# Patient Record
Sex: Female | Born: 1965 | Race: White | Hispanic: No | Marital: Single | Smoking: Never smoker
Health system: Southern US, Community
[De-identification: ages and names within clinical notes are randomized; demographics above are authoritative.]

## PROBLEM LIST (undated history)

## (undated) DIAGNOSIS — K219 Gastro-esophageal reflux disease without esophagitis: Secondary | ICD-10-CM

## (undated) DIAGNOSIS — M810 Age-related osteoporosis without current pathological fracture: Secondary | ICD-10-CM

## (undated) DIAGNOSIS — R51 Headache: Secondary | ICD-10-CM

## (undated) DIAGNOSIS — F419 Anxiety disorder, unspecified: Secondary | ICD-10-CM

## (undated) DIAGNOSIS — R002 Palpitations: Secondary | ICD-10-CM

## (undated) DIAGNOSIS — I493 Ventricular premature depolarization: Secondary | ICD-10-CM

## (undated) HISTORY — PX: ABDOMINAL HYSTERECTOMY: SHX81

## (undated) HISTORY — DX: Ventricular premature depolarization: I49.3

## (undated) HISTORY — DX: Gastro-esophageal reflux disease without esophagitis: K21.9

## (undated) HISTORY — DX: Age-related osteoporosis without current pathological fracture: M81.0

## (undated) HISTORY — DX: Anxiety disorder, unspecified: F41.9

## (undated) HISTORY — DX: Palpitations: R00.2

## (undated) HISTORY — PX: TUBAL LIGATION: SHX77

---

## 1999-01-15 ENCOUNTER — Other Ambulatory Visit: Admission: RE | Admit: 1999-01-15 | Discharge: 1999-01-15 | Payer: Self-pay | Admitting: *Deleted

## 2000-09-23 ENCOUNTER — Other Ambulatory Visit: Admission: RE | Admit: 2000-09-23 | Discharge: 2000-09-23 | Payer: Self-pay | Admitting: Obstetrics and Gynecology

## 2000-09-23 ENCOUNTER — Encounter (INDEPENDENT_AMBULATORY_CARE_PROVIDER_SITE_OTHER): Payer: Self-pay

## 2002-06-20 ENCOUNTER — Ambulatory Visit (HOSPITAL_COMMUNITY): Admission: RE | Admit: 2002-06-20 | Discharge: 2002-06-20 | Payer: Self-pay | Admitting: Gastroenterology

## 2002-08-16 ENCOUNTER — Other Ambulatory Visit: Admission: RE | Admit: 2002-08-16 | Discharge: 2002-08-16 | Payer: Self-pay | Admitting: Obstetrics and Gynecology

## 2004-01-13 ENCOUNTER — Emergency Department (HOSPITAL_COMMUNITY): Admission: EM | Admit: 2004-01-13 | Discharge: 2004-01-13 | Payer: Self-pay | Admitting: Emergency Medicine

## 2006-09-19 ENCOUNTER — Ambulatory Visit: Payer: Self-pay | Admitting: Internal Medicine

## 2006-09-19 ENCOUNTER — Encounter: Payer: Self-pay | Admitting: *Deleted

## 2006-09-20 ENCOUNTER — Observation Stay (HOSPITAL_COMMUNITY): Admission: EM | Admit: 2006-09-20 | Discharge: 2006-09-20 | Payer: Self-pay | Admitting: Internal Medicine

## 2006-09-21 ENCOUNTER — Ambulatory Visit: Payer: Self-pay

## 2006-09-28 ENCOUNTER — Ambulatory Visit: Payer: Self-pay | Admitting: Internal Medicine

## 2006-10-01 ENCOUNTER — Encounter: Payer: Self-pay | Admitting: Cardiology

## 2006-10-01 ENCOUNTER — Ambulatory Visit: Payer: Self-pay

## 2006-10-04 ENCOUNTER — Ambulatory Visit: Payer: Self-pay | Admitting: Internal Medicine

## 2008-08-18 ENCOUNTER — Emergency Department (HOSPITAL_COMMUNITY): Admission: EM | Admit: 2008-08-18 | Discharge: 2008-08-18 | Payer: Self-pay | Admitting: Emergency Medicine

## 2010-05-08 ENCOUNTER — Other Ambulatory Visit: Payer: Self-pay | Admitting: Emergency Medicine

## 2010-05-08 ENCOUNTER — Observation Stay (HOSPITAL_COMMUNITY): Admission: AD | Admit: 2010-05-08 | Discharge: 2010-05-09 | Payer: Self-pay | Admitting: Cardiology

## 2010-05-08 ENCOUNTER — Ambulatory Visit: Payer: Self-pay | Admitting: Cardiology

## 2010-06-30 DIAGNOSIS — R0789 Other chest pain: Secondary | ICD-10-CM | POA: Insufficient documentation

## 2010-06-30 DIAGNOSIS — K219 Gastro-esophageal reflux disease without esophagitis: Secondary | ICD-10-CM | POA: Insufficient documentation

## 2010-06-30 DIAGNOSIS — I4949 Other premature depolarization: Secondary | ICD-10-CM | POA: Insufficient documentation

## 2010-06-30 DIAGNOSIS — R002 Palpitations: Secondary | ICD-10-CM | POA: Insufficient documentation

## 2010-07-11 ENCOUNTER — Encounter (INDEPENDENT_AMBULATORY_CARE_PROVIDER_SITE_OTHER): Payer: Self-pay | Admitting: *Deleted

## 2010-08-17 ENCOUNTER — Encounter: Payer: Self-pay | Admitting: Obstetrics and Gynecology

## 2010-08-28 NOTE — Letter (Signed)
Summary: Appointment - Missed  [facility], Main Office  [address]   [place], [place] [address]   Phone: [phone]  Fax: [phone]     July 11, 2010 MRN: [id number]   [name] [address] [place], [place]  [address]   Dear Ms. Buschman,  Our records indicate you missed your appointment on  07-01-10  with Dr. Ladona Ridgel .                                    It is very important that we reach you to reschedule this appointment. We look forward to participating in your health care needs. Please contact us at the number listed above at your earliest convenience to reschedule this appointment.     Sincerely,    Glass blower/designer

## 2010-10-09 LAB — CARDIAC PANEL(CRET KIN+CKTOT+MB+TROPI)
CK, MB: 0.3 ng/mL (ref 0.3–4.0)
CK, MB: 0.3 ng/mL (ref 0.3–4.0)
CK, MB: 0.4 ng/mL (ref 0.3–4.0)
Relative Index: INVALID (ref 0.0–2.5)
Relative Index: INVALID (ref 0.0–2.5)
Relative Index: INVALID (ref 0.0–2.5)
Total CK: 49 U/L (ref 7–177)
Troponin I: 0.01 ng/mL (ref 0.00–0.06)
Troponin I: 0.01 ng/mL (ref 0.00–0.06)

## 2010-10-09 LAB — POCT CARDIAC MARKERS
CKMB, poc: <1 ng/mL — ABNORMAL LOW (ref 1.0–8.0)
Myoglobin, poc: 21.1 ng/mL (ref 12–200)
Myoglobin, poc: 27.4 ng/mL (ref 12–200)
Troponin i, poc: <0.05 ng/mL (ref 0.00–0.09)

## 2010-10-09 LAB — BASIC METABOLIC PANEL
BUN: 16 mg/dL (ref 6–23)
CO2: 22 mEq/L (ref 19–32)
Calcium: 9.3 mg/dL (ref 8.4–10.5)
Chloride: 109 mEq/L (ref 96–112)
Creatinine, Ser: 0.9 mg/dL (ref 0.4–1.2)

## 2010-10-09 LAB — CBC
MCH: 32.2 pg (ref 26.0–34.0)
MCV: 94.8 fL (ref 78.0–100.0)
Platelets: 250 10*3/uL (ref 150–400)
RDW: 13.5 % (ref 11.5–15.5)

## 2010-10-09 LAB — HEMOGLOBIN A1C: Mean Plasma Glucose: 105 mg/dL (ref <117)

## 2010-10-09 LAB — DIFFERENTIAL: Eosinophils Relative: 1 % (ref 0–5)

## 2010-10-13 ENCOUNTER — Ambulatory Visit
Admission: RE | Admit: 2010-10-13 | Discharge: 2010-10-13 | Disposition: A | Discharge status: Home / Self Care | Payer: BC Managed Care – PPO | Source: Ambulatory Visit | Attending: Emergency Medicine | Admitting: Emergency Medicine

## 2010-10-13 ENCOUNTER — Other Ambulatory Visit: Payer: Self-pay | Admitting: Emergency Medicine

## 2010-10-13 DIAGNOSIS — M79669 Pain in unspecified lower leg: Secondary | ICD-10-CM

## 2010-11-20 ENCOUNTER — Encounter: Payer: Self-pay | Admitting: Internal Medicine

## 2010-11-20 ENCOUNTER — Encounter: Payer: Self-pay | Admitting: Cardiovascular Disease

## 2010-11-21 ENCOUNTER — Encounter: Payer: Self-pay | Admitting: Physician Assistant

## 2010-11-24 ENCOUNTER — Ambulatory Visit: Payer: BC Managed Care – PPO | Admitting: Physician Assistant

## 2010-12-12 NOTE — H&P (Signed)
NAMEKARMELLA, BOUVIER               ACCOUNT NO.:  [id number]   MEDICAL RECORD NO.:  [id number]          PATIENT TYPE:  EMS   LOCATION:  ED                           FACILITY:  [facility]   PHYSICIAN:  Bevelyn Buckles. Bensimhon, MDDATE OF BIRTH:  26-Apr-1966   DATE OF ADMISSION:  09/19/2006  DATE OF DISCHARGE:                              HISTORY & PHYSICAL   REASON FOR ADMISSION:  Palpitations and chest pain.   Ms. Josephson is a 45 year old woman with a history of symptomatic PVCs.  She has no known history of coronary artery disease, hypertension,  hyperlipidemia or diabetes.  She tells me she was diagnosed with PVCs  about 20 years ago and had seen a cardiologist at that time.  They  wanted to start her on beta blockers but they were worried about her  resting bradycardia.  Over the years, she has had multiple heart  monitors without significant arrhythmias, most recent one was about 5  years ago.  She also has a history of recurrent syncope, which started  as a child and she remembers having a neurologic workup as a child which  was negative.  She says the single episodes have now resolved because  she can tell when she is about to pass out, lays down and feels better.  She says she is fairly active.  She rides a bike 30 minutes at a time  without any problem.   Tonight at work, she felt her heart rate go up into the 70s and then  come down into the 30s and felt like she was having PVCs again.  These  are quite frequent for her.  She subsequently then felt weak and  somewhat short of breath with bandlike constant chest pain.  She was  brought to the emergency room for further evaluation.  In the emergency  room, her heart rate was in the 50s with occasional PVCs.  EKG was  without ischemia.  Point of care markers were negative x1.   REVIEW OF SYSTEMS:  She denies anxiety or depression.  No fevers,  chills, no vomiting, no abdominal pain.  She denies being pregnant.  She  has not had any  neurologic symptoms.  She does have occasional  headaches.  The remainder of the review of systems is negative except  for history of present illness and problem list.   PAST MEDICAL HISTORY:  Symptomatic PVCs as described above.   MEDICATIONS:  None.   SOCIAL HISTORY:  She is divorced.  She has 3 children.  She denies any  smoking.  She does drink occasional alcohol.  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   FAMILY HISTORY:  She does not know her biological father.  Mother is in  41s alive and well.  She has 3 brothers who are all healthy.   PHYSICAL EXAMINATION:  She is lying in bed in no acute distress.  She  does appear mildly anxious but she denies this.  Respirations are  unlabored.  Blood pressure is 140/90.  Heart rate is in  the 50s,  saturating in the high 90s on room air.  HEENT:  Sclerae anicteric.  EOMI.  There are no xanthelasma.  Mucus  membranes are moist.  Oropharynx is clear.  Neck is supple.  No JVD.  Carotids are 2+ bilaterally without bruits.  There is no lymphadenopathy  or thyromegaly.  CARDIAC:  She is bradycardic and regular.  No murmurs, gallops, or rubs.  There does not appear to be any clicks.  LUNGS:  Clear.  ABDOMEN:  Soft, nontender, and nondistended.  No hepatosplenomegaly, no  bruits, no masses.  Good bowel sounds.  EXTREMITIES:  Warm with no clubbing, cyanosis, or edema.  There is no  rashes.  There is good distal pulses.  NEUROLOGICAL:  She does appear a bit anxious.  Cranial nerves II through  XII are intact.  Moves all four extremities without difficulty.   EKG:  There are multiple initial onset of sinus bradycardia at a rate of  59 with frequent PVCs, normal axis and intervals.  No preexcitation.  There is nonspecific ST changes.   Labs show point of care markers troponin is less than 0.05, CK-MB is  less than 1.  Sodium is 140, potassium 4.2, chloride 108, bicarb is 27,  glucose 97, BUN 10,  creatinine 0.8.   ASSESSMENT:  1. Palpitations.  2. Chest pain, atypical.  3. History of symptomatic premature ventricular contractures with      bradycardia as described above.  4. Elevated blood pressure, question new diagnosis of hypertension.   PLAN/DISCUSSION:  I suspect Ms. Lessig's symptoms are due to her PVCs  but may also have a component of anxiety (which she denies).  We will  observe her for 23 hours on telemetry, rule out myocardial infarction.  We will also check a d-dimer and TSH.  If the workup is negative, we can  check an outpatient echo, Myoview and Holter.  I did consider possible  pheochromocytoma but I think her history is probably not consistent with  this.  If she continues to have symptoms, it would be very reasonable to  check urine studies.  We will also check a urine drug screen and a urine  pregnancy test.      Bevelyn Buckles. Bensimhon, MD  Electronically Signed     DRB/MEDQ  D:  09/19/2006  T:  09/19/2006  Job:  [id number]

## 2010-12-12 NOTE — Discharge Summary (Signed)
[name], [name]               ACCOUNT NO.:  [id number]   MEDICAL RECORD NO.:  [id number]          PATIENT TYPE:  OBV   LOCATION:  0104                         FACILITY:  [facility]   PHYSICIAN:  Bevelyn Buckles. Bensimhon, MDDATE OF BIRTH:  25-Aug-1965   DATE OF ADMISSION:  09/19/2006  DATE OF DISCHARGE:  09/20/2006                         DISCHARGE SUMMARY - REFERRING   DISCHARGE DIAGNOSES:  1. Symptomatic premature ventricular contractions.  2. Atypical chest discomfort.   SUMMARY OF HISTORY:  [name] is a 45 year old female who presents  with symptomatic PVCs.  She states that she was diagnosed with PVCs  approximately 20 years ago and saw a cardiologist at that time.  They  wanted to start her on beta-blocker, but they were concerned about  resting bradycardia.  She has had several heart monitors in the past  without significant arrhythmias, the most recent about five years ago.  She has also had a history of syncope which she had a neurological work-  up as a child and was essentially negative.   While at work she felt her heart rate go up into the 70s and then down  in the 30s and felt like she was having PVC's again.  She feels that  these are frequent associated with weakness, slight shortness of breath  and a band-like constant chest pain.   PAST MEDICAL HISTORY:  Essentially unremarkable.   LABORATORY DATA:  Chest x-ray shows no active disease.  Sodium was 140,  potassium 4.2, BUN 10, creatinine 0.8, glucose 97, d-dimer less than  0.22.  CK-MB is 58.8, troponin 0.02.  She is not pregnant.  Urine drug  screen was negative.  Urinalysis showed moderate blood.  EKG's showed  sinus bradycardia, normal axis, nonspecific ST-T wave changes.   HOSPITAL COURSE:  The patient was admitted overnight for observation.  By the morning she stated that she felt fine.  Dr. Gala Romney felt that  she could be discharged home with outpatient evaluation.  However, the  patient is very upset that  she did not want to leave the hospital until  they found out what was wrong with her.  She became very upset with Dr.  Gala Romney insisting that something was wrong.  Dr. Gala Romney reiterated  he did not find any objective findings on exam, telemetry or labs and  felt that she could be discharged home with outpatient follow up.   DISPOSITION:  She is discharged home.  Activities and wound care are not  applicable or restricted.  She will have event monitor arranged.  The  office will call her with this.  An echocardiogram will be performed on  10/05/2006 at 08:30, a stress Myoview on 10/05/2006 at 09:30.  She was  advised nothing to eat or drink after midnight prior to her test and  that she should receive instructions in the mail.  She will follow up  with Dr. Ladona Ridgel, an electrophysiologist,  on 10/11/2006 at 04:00 p.m. to further discuss her symptomatic PVC's and  her above test results.  It is noted at the time of this dictation that  TSH is pending.  DISCHARGE TIME:  Thirty-five minutes.      Joellyn Rued, PA-C      Bevelyn Buckles. Bensimhon, MD  Electronically Signed    EW/MEDQ  D:  09/20/2006  T:  09/20/2006  Job:  [id number]

## 2010-12-12 NOTE — Assessment & Plan Note (Signed)
[facility]                         ELECTROPHYSIOLOGY OFFICE NOTE   NAME:HAUGHTAayat, [name]                        MRN:          [id number]  DATE:10/04/2006                            DOB:          1966/05/11    REFERRING PHYSICIAN:  Bevelyn Buckles. Bensimhon, MD   [name] is referred today by Dr. Nicholes Mango for evaluation of  symptomatic PVCs and palpitations.  The patient is a very pleasant 45-  year-old woman with a history of palpitations and syncope off and on for  many years.  Her first problems occurred back when she was age 83, and  she passed out.  At that time, evaluation by her report was that she had  an abnormal EEG, although this was never again really worked up.  She  had her initial episode of syncope at age 68 and typically would have a  single episode yearly.  At that time, she did note that she was under  increased stress as a single mother with three children.  The patient,  during those periods of time, would note on standing or sitting, she  would have syncope but learned that by lying down flat, she could often  avoid these episodes.  She noted that in her late 34s, she began having  symptomatic PVCs.  These would wax and wane and typically be associated  with diaphoresis,  the patient exercises regularly and has always had a  resting bradycardia but noted that when she felt her diaphoresis and  palpitations, that her heart rate would be in the 70s to 80s range.  This would typically last for 1-2 hours before stopping.  Following  this, she would feel tired and weak.  She also complained of some  episodic chest pressure, although not really related to her  palpitations.   Additional past medical history is really unremarkable.  She has three  grown children.   SOCIAL HISTORY:  Patient is divorced.  She has three children.  She  denies tobacco abuse or caffeine use.  She does drink occasional  alcoholic beverages.  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   Her family history is notable for both parents, mother being alive and  healthy, her father's history is unknown.  She has no knowledge of any  history on her father.   REVIEW OF SYSTEMS:  Negative except as noted in the HPI.   PHYSICAL EXAMINATION:  GENERAL:  She is a pleasant, well-appearing 30-  year-old woman in no acute distress.  VITAL SIGNS:  Blood pressure was 128/80, pulse 56 and regular,  respirations 18.  The weight was 158 pounds.  HEENT:  Normocephalic and atraumatic.  Pupils are equal and round.  Oropharynx is moist.  Sclerae are anicteric.  NECK:  No jugular venous distention.  There is no thyromegaly.  Trachea  is midline.  The carotids are 2+ and symmetric.  LUNGS:  Clear bilaterally to auscultation.  There are no wheezes, rales  or rhonchi.  CARDIOVASCULAR:  Regular rate and rhythm with normal S1 and S2.  There  are no murmurs, rubs or gallops appreciated.  There was an occasional  premature beat noted on physical exam.  ABDOMEN:  Soft, nontender, nondistended.  There is no organomegaly.  EXTREMITIES:  No clubbing, cyanosis or edema.  Pulses are 2+ and  symmetric.  NEUROLOGIC:  Alert and oriented x3.  Cranial nerves are intact.  Strength is 5/5 and symmetric.   The EKG demonstrates a sinus rhythm.  Review of her stress test and 2D  echo demonstrate preserved LV function.  She had frequent PVCs, which  resolved during exertion.  These PVCs were from a left bundle superior  axis orientation.   IMPRESSION:  1. Symptomatic premature ventricular contractions.  2. Remote history of syncope, none recently.   DISCUSSION:  The patient's symptoms are coming from her RV outflow  tract.  She has tried beta blockers in the past, which were not  particularly helpful.  Because she did not have increasing symptoms of  PVCs with exertion, in fact they got better.  I have recommended that we  try to give her p.r.n. verapamil to be  taken on an as-needed basis.  We  will start her with low strength 120 mg daily dose and follow her and  see how she does.     Doylene Canning. Ladona Ridgel, MD  Electronically Signed    GWT/MedQ  DD: 10/04/2006  DT: 10/04/2006  Job #: [id number]   cc:   Bevelyn Buckles. Bensimhon, MD

## 2011-04-28 ENCOUNTER — Ambulatory Visit: Payer: BC Managed Care – PPO | Admitting: Internal Medicine

## 2011-05-05 ENCOUNTER — Encounter: Payer: Self-pay | Admitting: Internal Medicine

## 2011-05-21 ENCOUNTER — Ambulatory Visit (INDEPENDENT_AMBULATORY_CARE_PROVIDER_SITE_OTHER): Payer: BC Managed Care – PPO | Admitting: Physician Assistant

## 2011-05-21 ENCOUNTER — Encounter: Payer: Self-pay | Admitting: Physician Assistant

## 2011-05-21 DIAGNOSIS — I4949 Other premature depolarization: Secondary | ICD-10-CM

## 2011-05-21 DIAGNOSIS — R002 Palpitations: Secondary | ICD-10-CM

## 2011-05-21 DIAGNOSIS — D649 Anemia, unspecified: Secondary | ICD-10-CM | POA: Insufficient documentation

## 2011-05-21 MED ORDER — VERAPAMIL HCL 80 MG PO TABS: 120.0000 mg | ORAL_TABLET | ORAL | Status: DC | PRN

## 2011-05-21 NOTE — Progress Notes (Signed)
HPI: This is a 45 year old white female patient who has been seen by Dr. Sharrell Ku in the past for syncope and PVCs. She had a workup in 2008 including stress Myoview that was normal. She has had event recorders that document PVCs. The patient claims she has 2 sinus nodes that I can find documentation and any prior records of this. She has had multiple appointments with Dr. Ladona Ridgel in the past that have been canceled. She saw Dr. Carolanne Grumbling in April 2012 for a similar symptoms and tried to get up every quarter but did not have a home phone and ended up turning it in early.  The patient states she is having increasing palpitations that occur at least once a month. They have been occurring once every 3-6 months. She describes quick fluttering sensation as well as skipping pounding associate with shortness of breath that happens off and on all day long for several days before it goes away.Recommend least to help but is not working as well as it used to. She does get dizzy with the episodes but has learned to lay down and the dizziness passes. She has not had any syncope and ears. After her episodes she develops a soreness in her chest for a few days. The palpitations are precipitated by her menstrual periods, heavy alcohol use, or high carb diet. She has been anemic and taking iron pills and feels that this may be aggravating her problem as well. She had labs recently at [facility] and was told she has a normal TSH. She is due to have follow-up lab work.  She is extremely active doing regular aerobics, mountain biking, and kick boxing. She does not have the palpitations when she is exercising.  No Known Allergies  No current outpatient prescriptions on file prior to visit.    Past Medical History  Diagnosis Date  . Chest pain   . Palpitations   . Premature ventricular contractions   . Acid reflux disease     No past surgical history on file.  Family History  Problem Relation Age of Onset    . Other      negative for premature CAD    History   Social History  . Marital Status: Divorced    Spouse Name: N/A    Number of Children: N/A  . Years of Education: N/A   Occupational History  . Not on file.   Social History Main Topics  . Smoking status: Never Smoker   . Smokeless tobacco: Not on file  . Alcohol Use: Yes     up to 6-10 per week  . Drug Use: No  . Sexually Active: Not on file   Other Topics Concern  . Not on file   Social History Narrative  . No narrative on file    ROS: See HPI Eyes: Negative Ears:Negative for hearing loss, tinnitus Cardiovascular: Negative for dyspnea on exertion, orthopnea, paroxysmal nocturnal dyspnia and syncope,edema, claudication, cyanosis,.  Respiratory:   Negative for cough, hemoptysis, shortness of breath, sleep disturbances due to breathing, sputum production and wheezing.   Endocrine: Negative for cold intolerance and heat intolerance.  Hematologic/Lymphatic: Negative for adenopathy . Does not bruise easily. Patient is having heavy menstrual periods Musculoskeletal: Negative.   Gastrointestinal: Negative for nausea, vomiting, reflux, abdominal pain, diarrhea, constipation.   Genitourinary: Negative for bladder incontinence, dysuria, flank pain, frequency, hematuria, hesitancy, nocturia and urgency.  Neurological: poor memory  Allergic/Immunologic: Negative for environmental allergies.   PHYSICAL EXAM: Well-nournished, in no  acute distress. Neck: No JVD, HJR, Bruit, or thyroid enlargement Lungs: No tachypnea, clear without wheezing, rales, or rhonchi Cardiovascular: RRR, PMI not displaced, heart sounds normal, no murmurs, gallops, bruit, thrill, or heave. Abdomen: BS normal. Soft without organomegaly, masses, lesions or tenderness. Extremities: without cyanosis, clubbing or edema. Good distal pulses bilateral SKin: Warm, no lesions or rashes  Musculoskeletal: No deformities Neuro: no focal signs  BP 114/68  Pulse  44  Ht 5\' 6"  (1.676 m)  Wt 168 lb 6.4 oz (76.386 kg)  BMI 27.18 kg/m2  BJY:NWGNF bradycardia otherwise normal

## 2011-05-21 NOTE — Patient Instructions (Signed)
Your physician recommends that you schedule a follow-up appointment in: 1 month with Dr Ladona Ridgel  Your physician has recommended that you wear an event monitor. Event monitors are medical devices that record the heart's electrical activity. Doctors most often [place] these monitors to diagnose arrhythmias. Arrhythmias are problems with the speed or rhythm of the heartbeat. The monitor is a small, portable device. You can wear one while you do your normal daily activities. This is usually used to diagnose what is causing palpitations/syncope (passing out).  Your physician has recommended you make the following change in your medication: INCREASE Verapamil to 1 1/2 tabs as needed

## 2011-05-21 NOTE — Assessment & Plan Note (Signed)
Patient has history of iron deficiency anemia secondary to heavy menstrual bleeding. She has been taking I am but has not had follow-up labs. I have requested that she do this.

## 2011-05-21 NOTE — Progress Notes (Signed)
Agree. cdm 

## 2011-05-21 NOTE — Assessment & Plan Note (Signed)
Patient has history of PVCs. Check labs in event recorder

## 2011-05-21 NOTE — Assessment & Plan Note (Addendum)
Patient has long history of palpitations with documented PVCs. She says she has to sinus nodes but I can't find documentation of this. She is having increased symptoms of palpitations recently precipitated by menstrual period, alcohol, or high carb diet. We will try to schedule an event recorder and have her follow-up with Dr. Ladona Ridgel. We are also trying to obtain lab work from [facility].We will increase her verapamil to 120 mg p.r.n.

## 2011-05-26 ENCOUNTER — Ambulatory Visit: Payer: BC Managed Care – PPO | Admitting: Internal Medicine

## 2011-05-27 ENCOUNTER — Encounter (INDEPENDENT_AMBULATORY_CARE_PROVIDER_SITE_OTHER): Payer: BC Managed Care – PPO

## 2011-05-27 DIAGNOSIS — R002 Palpitations: Secondary | ICD-10-CM

## 2011-06-23 ENCOUNTER — Ambulatory Visit: Payer: BC Managed Care – PPO | Admitting: Internal Medicine

## 2011-06-23 ENCOUNTER — Telehealth: Payer: Self-pay | Admitting: Internal Medicine

## 2011-06-23 NOTE — Telephone Encounter (Signed)
Called pt Monday to confirm appt for today and she cxl, said her dtr is due to have a baby, she called back today to rs and the next available was 08-11-11, she felt that was a long way off so I offered and  put her on waitlist,. Pt calling back again now to talk to nurse re "feeling" she's having in her chest for the last few days

## 2011-06-23 NOTE — Telephone Encounter (Signed)
Spoke with patient and let her know that the symptoms she is having could be related to her PVC's I have told her that she should follow up with her primary care MD  She did have an appointment scheduled for today and canceled but then called and wanted to reschedule and was told our first available time was in Jan 2013  She then wanted to talk to a nurse about her symptoms  I have tried to answer her questions but her symptoms are very vague.  She states this has been going on all her life and she just wants to get something done about it because she is missing work at least once a month.

## 2011-08-11 ENCOUNTER — Ambulatory Visit: Payer: BC Managed Care – PPO | Admitting: Internal Medicine

## 2011-10-16 ENCOUNTER — Other Ambulatory Visit: Payer: Self-pay

## 2011-10-16 ENCOUNTER — Other Ambulatory Visit: Payer: Self-pay | Admitting: Internal Medicine

## 2012-03-24 ENCOUNTER — Other Ambulatory Visit: Payer: Self-pay | Admitting: Gastroenterology

## 2012-03-24 DIAGNOSIS — R1013 Epigastric pain: Secondary | ICD-10-CM

## 2012-03-24 DIAGNOSIS — R110 Nausea: Secondary | ICD-10-CM

## 2012-03-30 ENCOUNTER — Encounter (HOSPITAL_COMMUNITY): Payer: Self-pay

## 2012-03-30 ENCOUNTER — Encounter (HOSPITAL_COMMUNITY)
Admission: RE | Admit: 2012-03-30 | Discharge: 2012-03-30 | Disposition: A | Discharge status: Home / Self Care | Payer: BC Managed Care – PPO | Source: Ambulatory Visit | Attending: Obstetrics and Gynecology | Admitting: Obstetrics and Gynecology

## 2012-03-30 ENCOUNTER — Other Ambulatory Visit: Payer: Self-pay | Admitting: Obstetrics and Gynecology

## 2012-03-30 HISTORY — DX: Headache: R51

## 2012-03-30 LAB — SURGICAL PCR SCREEN
MRSA, PCR: NEGATIVE
Staphylococcus aureus: NEGATIVE

## 2012-03-30 NOTE — Pre-Procedure Instructions (Signed)
Spoke with Dr Tonie Griffith Stress test, Echo, pt history of syncope and pvc's-has seen cardiologists in past and worn holter monitoring-last in 12/12-no Cardiologist since 2012-Requests cardiac clearance-Shanelle at Dr Jorene Minors office called-will let referral know of high priority-patient aware and will check with office in morning.

## 2012-03-30 NOTE — Patient Instructions (Addendum)
   Your procedure is scheduled on: Friday September 13th  Enter through the [employer] [facility] at: 11am Pick up the phone at the desk and dial [phone] and inform us of your arrival.  Please call this number if you have any problems the morning of surgery: [phone]  Remember: Do not eat food after midnight:Thursday You may have clear liquids until 8:30am on Friday Please take your Lexapro on day of surgery   Do not wear jewelry, make-up, or FINGER nail polish No metal in your hair or on your body. Do not wear lotions, powders, perfumes or deodorant. Do not shave 48 hours prior to surgery. Do not bring valuables to the hospital. .  Leave suitcase in the car. After Surgery it may be brought to your room. For patients being admitted to the hospital, checkout time is 11:00am the day of discharge.  Patients discharged on the day of surgery will not be allowed to drive home.     Remember to use your hibiclens as instructed.Please shower with 1/2 bottle the evening before your surgery and the other 1/2 bottle the morning of surgery. Neck down avoiding private area.

## 2012-04-04 ENCOUNTER — Telehealth: Payer: Self-pay | Admitting: Internal Medicine

## 2012-04-04 NOTE — Telephone Encounter (Signed)
Faxed on Friday. Will re-fax.

## 2012-04-04 NOTE — Telephone Encounter (Signed)
Pt calling re surg clearance for hysterectomy from dr Billy Coast pls call [phone]

## 2012-04-05 NOTE — Pre-Procedure Instructions (Signed)
Follow up call to [facility] regarding cardiac clearance. Left message to let us know ASAP.

## 2012-04-06 ENCOUNTER — Encounter (HOSPITAL_COMMUNITY)
Admission: RE | Admit: 2012-04-06 | Discharge: 2012-04-06 | Disposition: A | Discharge status: Home / Self Care | Payer: BC Managed Care – PPO | Source: Ambulatory Visit | Attending: Gastroenterology | Admitting: Gastroenterology

## 2012-04-06 ENCOUNTER — Ambulatory Visit (HOSPITAL_COMMUNITY)
Admission: RE | Admit: 2012-04-06 | Discharge: 2012-04-06 | Disposition: A | Discharge status: Home / Self Care | Payer: BC Managed Care – PPO | Source: Ambulatory Visit | Attending: Gastroenterology | Admitting: Gastroenterology

## 2012-04-06 DIAGNOSIS — K7689 Other specified diseases of liver: Secondary | ICD-10-CM | POA: Insufficient documentation

## 2012-04-06 DIAGNOSIS — R110 Nausea: Secondary | ICD-10-CM

## 2012-04-06 DIAGNOSIS — R1013 Epigastric pain: Secondary | ICD-10-CM | POA: Insufficient documentation

## 2012-04-06 MED ORDER — TECHNETIUM TC 99M MEBROFENIN IV KIT
5.0000 | PACK | Freq: Once | INTRAVENOUS | Status: AC | PRN
  Administered 2012-04-06: 5 via INTRAVENOUS

## 2012-04-06 MED ORDER — SINCALIDE 5 MCG IJ SOLR
0.0200 ug/kg | Freq: Once | INTRAMUSCULAR | Status: AC
  Administered 2012-04-06: 1.6 ug via INTRAVENOUS

## 2012-04-07 NOTE — H&P (Signed)
[name], [name]               ACCOUNT NO.:  [id number]  MEDICAL RECORD NO.:  [id number]  LOCATION:  PERIO                         FACILITY:  [facility]  PHYSICIAN:  Lenoard Aden, M.D.DATE OF BIRTH:  [birth year]  DATE OF ADMISSION:  03/07/2012 DATE OF DISCHARGE:                             HISTORY & PHYSICAL   CHIEF COMPLAINT:  Dysmenorrhea, menorrhagia.  HISTORY OF PRESENT ILLNESS:  She is a 46 year old white female, G3, P3 with a history of tubal ligation for definitive management of dysmenorrhea and menorrhagia.  MEDICATIONS:  Lexapro, adjustment as needed.  ALLERGIES:  TO CODEINE.  SOCIAL HISTORY:  She is a nonsmoker, nondrinker.  Denies domestic or physical violence.  OB/GYN HISTORY:  She has a history of 3 vaginal deliveries and a tubal ligation.  History of laparoscopy.  FAMILY HISTORY:  Hypertension, diabetes.  PHYSICAL EXAMINATION:  GENERAL:  She is a well-developed, well-nourished white female, in no acute respiratory distress. VITAL SIGNS:  Height of 63 inches, weight of 170 pounds. HEENT:  Normal. NECK:  Supple.  Full range of motion. LUNGS:  Clear. HEART:  Regular rhythm. ABDOMEN:  Soft, flat, nontender. PELVIC: bulky enlarge uterus.  No adnexal masses. EXTREMITIES:  There are no cords. NEUROLOGIC:  Nonfocal. SKIN:  Intact.  IMPRESSION:  Dysmenorrhea, menorrhagia.  PLAN:  daVinci assisted laparoscopic hysterectomy, bilateral salpingectomy. Risks of anesthesia, infection, bleeding, injury to abdominal organs, need for repair.  She had no immediate complications including bowel and bladder injury noted.  The patient acknowledges and wishes to proceed.     Lenoard Aden, M.D.     RJT/MEDQ  D:  04/07/2012  T:  04/07/2012  Job:  [id number]

## 2012-04-08 ENCOUNTER — Encounter (HOSPITAL_COMMUNITY): Payer: Self-pay | Admitting: Anesthesiology

## 2012-04-08 ENCOUNTER — Ambulatory Visit (HOSPITAL_COMMUNITY)
Admission: RE | Admit: 2012-04-08 | Discharge: 2012-04-09 | Disposition: A | Discharge status: Home / Self Care | Payer: BC Managed Care – PPO | Source: Ambulatory Visit | Attending: Obstetrics and Gynecology | Admitting: Obstetrics and Gynecology

## 2012-04-08 ENCOUNTER — Encounter (HOSPITAL_COMMUNITY)
Admission: RE | Disposition: A | Discharge status: Home / Self Care | Payer: Self-pay | Source: Ambulatory Visit | Attending: Obstetrics and Gynecology

## 2012-04-08 ENCOUNTER — Encounter (HOSPITAL_COMMUNITY): Payer: Self-pay

## 2012-04-08 ENCOUNTER — Ambulatory Visit (HOSPITAL_COMMUNITY): Payer: BC Managed Care – PPO | Admitting: Anesthesiology

## 2012-04-08 DIAGNOSIS — N8000 Endometriosis of the uterus, unspecified: Secondary | ICD-10-CM | POA: Insufficient documentation

## 2012-04-08 DIAGNOSIS — N815 Vaginal enterocele: Secondary | ICD-10-CM | POA: Insufficient documentation

## 2012-04-08 DIAGNOSIS — Z90710 Acquired absence of both cervix and uterus: Secondary | ICD-10-CM | POA: Diagnosis not present

## 2012-04-08 DIAGNOSIS — N946 Dysmenorrhea, unspecified: Secondary | ICD-10-CM | POA: Insufficient documentation

## 2012-04-08 DIAGNOSIS — D251 Intramural leiomyoma of uterus: Secondary | ICD-10-CM | POA: Insufficient documentation

## 2012-04-08 LAB — CBC
HCT: 44.3 % (ref 36.0–46.0)
MCHC: 34.5 g/dL (ref 30.0–36.0)
MCV: 97.8 fL (ref 78.0–100.0)
Platelets: 249 10*3/uL (ref 150–400)
RDW: 12.5 % (ref 11.5–15.5)

## 2012-04-08 SURGERY — ROBOTIC ASSISTED TOTAL HYSTERECTOMY
Anesthesia: General | Site: Abdomen | Wound class: Clean Contaminated

## 2012-04-08 MED ORDER — HYDROMORPHONE HCL PF 1 MG/ML IJ SOLN
INTRAMUSCULAR | Status: AC
  Filled 2012-04-08: qty 1

## 2012-04-08 MED ORDER — FENTANYL CITRATE 0.05 MG/ML IJ SOLN
INTRAMUSCULAR | Status: AC
  Filled 2012-04-08: qty 5

## 2012-04-08 MED ORDER — ROCURONIUM BROMIDE 50 MG/5ML IV SOLN
INTRAVENOUS | Status: AC
  Filled 2012-04-08: qty 1

## 2012-04-08 MED ORDER — LACTATED RINGERS IR SOLN
Status: DC | PRN
  Administered 2012-04-08: 3000 mL

## 2012-04-08 MED ORDER — PROPOFOL 10 MG/ML IV BOLUS
INTRAVENOUS | Status: DC | PRN
  Administered 2012-04-08: 200 mg via INTRAVENOUS

## 2012-04-08 MED ORDER — HYDROMORPHONE HCL PF 1 MG/ML IJ SOLN
0.2500 mg | INTRAMUSCULAR | Status: DC | PRN
  Administered 2012-04-08: 0.25 mg via INTRAVENOUS
  Administered 2012-04-08 (×2): 0.5 mg via INTRAVENOUS
  Administered 2012-04-08: 0.25 mg via INTRAVENOUS
  Administered 2012-04-08: 0.5 mg via INTRAVENOUS

## 2012-04-08 MED ORDER — HYDROMORPHONE HCL PF 1 MG/ML IJ SOLN
INTRAMUSCULAR | Status: AC
  Administered 2012-04-08: 0.25 mg via INTRAVENOUS
  Filled 2012-04-08: qty 1

## 2012-04-08 MED ORDER — MEPERIDINE HCL 25 MG/ML IJ SOLN: 6.2500 mg | INTRAMUSCULAR | Status: DC | PRN

## 2012-04-08 MED ORDER — GLYCOPYRROLATE 0.2 MG/ML IJ SOLN
INTRAMUSCULAR | Status: DC | PRN
  Administered 2012-04-08: 0.4 mg via INTRAVENOUS
  Administered 2012-04-08 (×2): 0.1 mg via INTRAVENOUS

## 2012-04-08 MED ORDER — HYDROMORPHONE HCL PF 1 MG/ML IJ SOLN
INTRAMUSCULAR | Status: DC | PRN
  Administered 2012-04-08: 0.5 mg via INTRAVENOUS

## 2012-04-08 MED ORDER — NEOSTIGMINE METHYLSULFATE 1 MG/ML IJ SOLN
INTRAMUSCULAR | Status: DC | PRN
  Administered 2012-04-08: 2 mg via INTRAVENOUS

## 2012-04-08 MED ORDER — METOCLOPRAMIDE HCL 5 MG/ML IJ SOLN: 10.0000 mg | Freq: Once | INTRAMUSCULAR | Status: DC | PRN

## 2012-04-08 MED ORDER — LACTATED RINGERS IV SOLN
INTRAVENOUS | Status: DC | PRN
  Administered 2012-04-08 (×3): via INTRAVENOUS

## 2012-04-08 MED ORDER — LIDOCAINE HCL (CARDIAC) 20 MG/ML IV SOLN
INTRAVENOUS | Status: AC
  Filled 2012-04-08: qty 5

## 2012-04-08 MED ORDER — OXYCODONE-ACETAMINOPHEN 5-325 MG PO TABS
1.0000 | ORAL_TABLET | ORAL | Status: DC | PRN
  Administered 2012-04-08 – 2012-04-09 (×5): 2 via ORAL
  Filled 2012-04-08 (×5): qty 2

## 2012-04-08 MED ORDER — MIDAZOLAM HCL 5 MG/5ML IJ SOLN
INTRAMUSCULAR | Status: DC | PRN
  Administered 2012-04-08: 2 mg via INTRAVENOUS

## 2012-04-08 MED ORDER — SCOPOLAMINE 1 MG/3DAYS TD PT72
1.0000 | MEDICATED_PATCH | TRANSDERMAL | Status: DC
  Administered 2012-04-08: 1.5 mg via TRANSDERMAL

## 2012-04-08 MED ORDER — KETOROLAC TROMETHAMINE 60 MG/2ML IM SOLN
INTRAMUSCULAR | Status: AC
  Filled 2012-04-08: qty 2

## 2012-04-08 MED ORDER — DEXAMETHASONE SODIUM PHOSPHATE 10 MG/ML IJ SOLN
INTRAMUSCULAR | Status: DC | PRN
  Administered 2012-04-08: 10 mg via INTRAVENOUS

## 2012-04-08 MED ORDER — EPHEDRINE 5 MG/ML INJ
INTRAVENOUS | Status: AC
  Filled 2012-04-08: qty 10

## 2012-04-08 MED ORDER — GLYCOPYRROLATE 0.2 MG/ML IJ SOLN
INTRAMUSCULAR | Status: AC
  Filled 2012-04-08: qty 1

## 2012-04-08 MED ORDER — DEXTROSE IN LACTATED RINGERS 5 % IV SOLN
INTRAVENOUS | Status: DC
  Administered 2012-04-08: 18:00:00 via INTRAVENOUS

## 2012-04-08 MED ORDER — TRAMADOL HCL 50 MG PO TABS: 50.0000 mg | ORAL_TABLET | Freq: Four times a day (QID) | ORAL | Status: DC | PRN

## 2012-04-08 MED ORDER — NALOXONE HCL 0.4 MG/ML IJ SOLN: 0.4000 mg | INTRAMUSCULAR | Status: DC | PRN

## 2012-04-08 MED ORDER — CEFAZOLIN SODIUM-DEXTROSE 2-3 GM-% IV SOLR
2.0000 g | INTRAVENOUS | Status: AC
  Administered 2012-04-08: 2 g via INTRAVENOUS

## 2012-04-08 MED ORDER — ONDANSETRON HCL 4 MG/2ML IJ SOLN
INTRAMUSCULAR | Status: AC
  Filled 2012-04-08: qty 2

## 2012-04-08 MED ORDER — MUPIROCIN 2 % EX OINT
TOPICAL_OINTMENT | CUTANEOUS | Status: AC
  Filled 2012-04-08: qty 22

## 2012-04-08 MED ORDER — FENTANYL CITRATE 0.05 MG/ML IJ SOLN
INTRAMUSCULAR | Status: DC | PRN
  Administered 2012-04-08 (×2): 100 ug via INTRAVENOUS
  Administered 2012-04-08: 50 ug via INTRAVENOUS

## 2012-04-08 MED ORDER — DEXAMETHASONE SODIUM PHOSPHATE 10 MG/ML IJ SOLN
INTRAMUSCULAR | Status: AC
  Filled 2012-04-08: qty 1

## 2012-04-08 MED ORDER — ROCURONIUM BROMIDE 100 MG/10ML IV SOLN
INTRAVENOUS | Status: DC | PRN
  Administered 2012-04-08 (×3): 10 mg via INTRAVENOUS
  Administered 2012-04-08: 40 mg via INTRAVENOUS

## 2012-04-08 MED ORDER — PROPOFOL 10 MG/ML IV EMUL
INTRAVENOUS | Status: AC
  Filled 2012-04-08: qty 20

## 2012-04-08 MED ORDER — GLYCOPYRROLATE 0.2 MG/ML IJ SOLN
INTRAMUSCULAR | Status: AC
  Filled 2012-04-08: qty 2

## 2012-04-08 MED ORDER — SCOPOLAMINE 1 MG/3DAYS TD PT72
MEDICATED_PATCH | TRANSDERMAL | Status: AC
  Administered 2012-04-08: 1.5 mg via TRANSDERMAL
  Filled 2012-04-08: qty 1

## 2012-04-08 MED ORDER — NEOSTIGMINE METHYLSULFATE 1 MG/ML IJ SOLN
INTRAMUSCULAR | Status: AC
  Filled 2012-04-08: qty 10

## 2012-04-08 MED ORDER — DIPHENHYDRAMINE HCL 12.5 MG/5ML PO ELIX
12.5000 mg | ORAL_SOLUTION | Freq: Four times a day (QID) | ORAL | Status: DC | PRN
  Filled 2012-04-08: qty 5

## 2012-04-08 MED ORDER — LIDOCAINE HCL (CARDIAC) 20 MG/ML IV SOLN
INTRAVENOUS | Status: DC | PRN
  Administered 2012-04-08: 50 mg via INTRAVENOUS

## 2012-04-08 MED ORDER — MUPIROCIN 2 % EX OINT: TOPICAL_OINTMENT | Freq: Two times a day (BID) | CUTANEOUS | Status: DC

## 2012-04-08 MED ORDER — EPHEDRINE SULFATE 50 MG/ML IJ SOLN
INTRAMUSCULAR | Status: DC | PRN
  Administered 2012-04-08: 5 mg via INTRAVENOUS

## 2012-04-08 MED ORDER — HYDROMORPHONE HCL PF 1 MG/ML IJ SOLN
INTRAMUSCULAR | Status: AC
  Administered 2012-04-08: 0.5 mg via INTRAVENOUS
  Filled 2012-04-08: qty 1

## 2012-04-08 MED ORDER — ONDANSETRON HCL 4 MG/2ML IJ SOLN
4.0000 mg | Freq: Four times a day (QID) | INTRAMUSCULAR | Status: DC | PRN
  Administered 2012-04-08: 4 mg via INTRAVENOUS
  Filled 2012-04-08: qty 2

## 2012-04-08 MED ORDER — BUPIVACAINE HCL (PF) 0.25 % IJ SOLN
INTRAMUSCULAR | Status: DC | PRN
  Administered 2012-04-08: 15 mL

## 2012-04-08 MED ORDER — ESCITALOPRAM OXALATE 5 MG PO TABS
5.0000 mg | ORAL_TABLET | Freq: Every day | ORAL | Status: DC
  Administered 2012-04-09: 5 mg via ORAL
  Filled 2012-04-08: qty 1

## 2012-04-08 MED ORDER — ZOLPIDEM TARTRATE 5 MG PO TABS: 5.0000 mg | ORAL_TABLET | Freq: Every evening | ORAL | Status: DC | PRN

## 2012-04-08 MED ORDER — BUPIVACAINE HCL (PF) 0.25 % IJ SOLN
INTRAMUSCULAR | Status: AC
  Filled 2012-04-08: qty 30

## 2012-04-08 MED ORDER — ONDANSETRON HCL 4 MG/2ML IJ SOLN
INTRAMUSCULAR | Status: DC | PRN
  Administered 2012-04-08: 4 mg via INTRAVENOUS

## 2012-04-08 MED ORDER — KETOROLAC TROMETHAMINE 30 MG/ML IJ SOLN
INTRAMUSCULAR | Status: DC | PRN
  Administered 2012-04-08: 30 mg via INTRAVENOUS

## 2012-04-08 MED ORDER — LACTATED RINGERS IV SOLN
INTRAVENOUS | Status: DC
  Administered 2012-04-08: 11:00:00 via INTRAVENOUS

## 2012-04-08 MED ORDER — DIPHENHYDRAMINE HCL 50 MG/ML IJ SOLN: 12.5000 mg | Freq: Four times a day (QID) | INTRAMUSCULAR | Status: DC | PRN

## 2012-04-08 MED ORDER — SODIUM CHLORIDE 0.9 % IJ SOLN: 9.0000 mL | INTRAMUSCULAR | Status: DC | PRN

## 2012-04-08 MED ORDER — MIDAZOLAM HCL 2 MG/2ML IJ SOLN
INTRAMUSCULAR | Status: AC
  Filled 2012-04-08: qty 2

## 2012-04-08 MED ORDER — KETOROLAC TROMETHAMINE 60 MG/2ML IM SOLN
INTRAMUSCULAR | Status: DC | PRN
  Administered 2012-04-08: 30 mg via INTRAMUSCULAR

## 2012-04-08 MED ORDER — CEFAZOLIN SODIUM-DEXTROSE 2-3 GM-% IV SOLR
INTRAVENOUS | Status: AC
  Filled 2012-04-08: qty 50

## 2012-04-08 MED ORDER — HYDROMORPHONE 0.3 MG/ML IV SOLN
INTRAVENOUS | Status: DC
  Administered 2012-04-08: 17:00:00 via INTRAVENOUS
  Administered 2012-04-08: 2.1 mg via INTRAVENOUS
  Filled 2012-04-08: qty 25

## 2012-04-08 SURGICAL SUPPLY — 78 items
BAG URINE DRAINAGE (UROLOGICAL SUPPLIES) ×3 IMPLANT
BARRIER ADHS 3X4 INTERCEED (GAUZE/BANDAGES/DRESSINGS) ×3 IMPLANT
BRR ADH 4X3 ABS CNTRL BYND (GAUZE/BANDAGES/DRESSINGS) ×1
CABLE HIGH FREQUENCY MONO STRZ (ELECTRODE) ×3 IMPLANT
CATH FOLEY 3WAY  5CC 16FR (CATHETERS) ×1
CATH FOLEY 3WAY 5CC 16FR (CATHETERS) ×2 IMPLANT
CHLORAPREP W/TINT 26ML (MISCELLANEOUS) ×6 IMPLANT
CLOTH BEACON ORANGE TIMEOUT ST (SAFETY) ×3 IMPLANT
CONT PATH 16OZ SNAP LID 3702 (MISCELLANEOUS) ×3 IMPLANT
COVER MAYO STAND STRL (DRAPES) ×3 IMPLANT
COVER TABLE BACK 60X90 (DRAPES) ×6 IMPLANT
COVER TIP SHEARS 8 DVNC (MISCELLANEOUS) ×2 IMPLANT
COVER TIP SHEARS 8MM DA VINCI (MISCELLANEOUS) ×1
DECANTER SPIKE VIAL GLASS SM (MISCELLANEOUS) ×3 IMPLANT
DERMABOND ADVANCED (GAUZE/BANDAGES/DRESSINGS) ×1
DERMABOND ADVANCED .7 DNX12 (GAUZE/BANDAGES/DRESSINGS) ×2 IMPLANT
DRAPE HUG U DISPOSABLE (DRAPE) ×3 IMPLANT
DRAPE LG THREE QUARTER DISP (DRAPES) ×6 IMPLANT
DRAPE MONITOR DA VINCI (DRAPE) IMPLANT
DRAPE WARM FLUID 44X44 (DRAPE) ×3 IMPLANT
ELECT REM PT RETURN 9FT ADLT (ELECTROSURGICAL) ×3
ELECTRODE REM PT RTRN 9FT ADLT (ELECTROSURGICAL) ×2 IMPLANT
EVACUATOR SMOKE 8.L (FILTER) ×3 IMPLANT
GAUZE VASELINE 3X9 (GAUZE/BANDAGES/DRESSINGS) IMPLANT
GLOVE BIO SURGEON STRL SZ7.5 (GLOVE) ×9 IMPLANT
GLOVE BIOGEL PI IND STRL 7.0 (GLOVE) ×10 IMPLANT
GLOVE BIOGEL PI IND STRL 7.5 (GLOVE) ×10 IMPLANT
GLOVE BIOGEL PI INDICATOR 7.0 (GLOVE) ×5
GLOVE BIOGEL PI INDICATOR 7.5 (GLOVE) ×5
GLOVE SURG SS PI 6.5 STRL IVOR (GLOVE) ×9 IMPLANT
GLOVE SURG SS PI 7.0 STRL IVOR (GLOVE) ×9 IMPLANT
GOWN STRL REIN XL XLG (GOWN DISPOSABLE) ×27 IMPLANT
GYRUS RUMI II 2.5CM BLUE (DISPOSABLE)
GYRUS RUMI II 3.5CM BLUE (DISPOSABLE)
GYRUS RUMI II 4.0CM BLUE (DISPOSABLE)
KIT ACCESSORY DA VINCI DISP (KITS) ×1
KIT ACCESSORY DVNC DISP (KITS) ×2 IMPLANT
KIT DISP ACCESSORY 4 ARM (KITS) IMPLANT
LEGGING LITHOTOMY PAIR STRL (DRAPES) ×3 IMPLANT
NEEDLE INSUFFLATION 14GA 120MM (NEEDLE) ×3 IMPLANT
OCCLUDER COLPOPNEUMO (BALLOONS) ×6 IMPLANT
PACK LAVH (CUSTOM PROCEDURE TRAY) ×3 IMPLANT
PAD OB MATERNITY 4.3X12.25 (PERSONAL CARE ITEMS) ×3 IMPLANT
PAD PREP 24X48 CUFFED NSTRL (MISCELLANEOUS) ×6 IMPLANT
PLUG CATH AND CAP STER (CATHETERS) ×3 IMPLANT
PROTECTOR NERVE ULNAR (MISCELLANEOUS) ×6 IMPLANT
RUMI II 3.0CM BLUE KOH-EFFICIE (DISPOSABLE) ×3 IMPLANT
RUMI II GYRUS 2.5CM BLUE (DISPOSABLE) IMPLANT
RUMI II GYRUS 3.5CM BLUE (DISPOSABLE) IMPLANT
RUMI II GYRUS 4.0CM BLUE (DISPOSABLE) IMPLANT
SET CYSTO W/LG BORE CLAMP LF (SET/KITS/TRAYS/PACK) IMPLANT
SET IRRIG TUBING LAPAROSCOPIC (IRRIGATION / IRRIGATOR) ×3 IMPLANT
SOLUTION ELECTROLUBE (MISCELLANEOUS) ×3 IMPLANT
SPONGE LAP 18X18 X RAY DECT (DISPOSABLE) IMPLANT
SUT VIC AB 0 CT1 27 (SUTURE) ×4
SUT VIC AB 0 CT1 27XBRD ANBCTR (SUTURE) ×4 IMPLANT
SUT VIC AB 0 CT1 27XBRD ANTBC (SUTURE) IMPLANT
SUT VICRYL 0 UR6 27IN ABS (SUTURE) ×3 IMPLANT
SUT VICRYL RAPIDE 4/0 PS 2 (SUTURE) ×6 IMPLANT
SUT VLOC 180 0 9IN  GS21 (SUTURE) ×1
SUT VLOC 180 0 9IN GS21 (SUTURE) ×2 IMPLANT
SYR 50ML LL SCALE MARK (SYRINGE) ×3 IMPLANT
SYRINGE 10CC LL (SYRINGE) ×3 IMPLANT
SYSTEM CONVERTIBLE TROCAR (TROCAR) IMPLANT
TIP UTERINE 5.1X6CM LAV DISP (MISCELLANEOUS) IMPLANT
TIP UTERINE 6.7X10CM GRN DISP (MISCELLANEOUS) IMPLANT
TIP UTERINE 6.7X6CM WHT DISP (MISCELLANEOUS) IMPLANT
TIP UTERINE 6.7X8CM BLUE DISP (MISCELLANEOUS) IMPLANT
TOWEL OR 17X24 6PK STRL BLUE (TOWEL DISPOSABLE) ×9 IMPLANT
TROCAR DISP BLADELESS 8 DVNC (TROCAR) ×2 IMPLANT
TROCAR DISP BLADELESS 8MM (TROCAR) ×1
TROCAR XCEL 12X100 BLDLESS (ENDOMECHANICALS) IMPLANT
TROCAR XCEL NON-BLD 5MMX100MML (ENDOMECHANICALS) ×3 IMPLANT
TROCAR Z-THREAD 12X150 (TROCAR) ×3 IMPLANT
TROCAR Z-THREAD FIOS 12X100MM (TROCAR) IMPLANT
TUBING FILTER THERMOFLATOR (ELECTROSURGICAL) ×3 IMPLANT
WARMER LAPAROSCOPE (MISCELLANEOUS) ×6 IMPLANT
WATER STERILE IRR 1000ML POUR (IV SOLUTION) ×9 IMPLANT

## 2012-04-08 NOTE — Op Note (Signed)
04/08/2012  2:54 PM  PATIENT:  [name]  46 y.o. female  PRE-OPERATIVE DIAGNOSIS:  Symptomatic fibroids and dysmenorrhea  POST-OPERATIVE DIAGNOSIS:  Symptomatic Fibroids Dysmenorrhea Pelvic adhesions Enterocele  PROCEDURE:  Procedure(s): ROBOTIC ASSISTED TOTAL HYSTERECTOMY BILATERAL SALPINGECTOMY LYSIS OF PELVIC ADHESIONS CUL-DE-PLASTY  SURGEON:  Surgeon(s): Lenoard Aden, MD  ASSISTANTS: Fredric Mare, CNM   ANESTHESIA:   local and general  ESTIMATED BLOOD LOSS: * No blood loss amount entered *   DRAINS: Urinary Catheter (Foley)   LOCAL MEDICATIONS USED:  MARCAINE     SPECIMEN:  Source of Specimen:  UTERUS , CERVIX  AND TUBES  DISPOSITION OF SPECIMEN:  PATHOLOGY  COUNTS:  YES  DICTATION #: [id number]  PLAN OF CARE: DC HOME  PATIENT DISPOSITION:  PACU - hemodynamically stable.

## 2012-04-08 NOTE — Anesthesia Procedure Notes (Signed)
Procedure Name: Intubation Date/Time: 04/08/2012 12:39 PM Performed by: Graciela Husbands Pre-anesthesia Checklist: Suction available, Emergency Drugs available, Timeout performed, Patient being monitored and Patient identified Patient Re-evaluated:Patient Re-evaluated prior to inductionOxygen Delivery Method: Circle system utilized Preoxygenation: Pre-oxygenation with 100% oxygen Intubation Type: IV induction Ventilation: Mask ventilation without difficulty Laryngoscope Size: Mac and 4 Grade View: Grade I Tube type: Oral Tube size: 7.0 mm Number of attempts: 1 Airway Equipment and Method: Stylet Secured at: 21 cm Tube secured with: Tape Dental Injury: Teeth and Oropharynx as per pre-operative assessment

## 2012-04-08 NOTE — Transfer of Care (Signed)
Immediate Anesthesia Transfer of Care Note  Patient: [name]  Procedure(s) Performed: Procedure(s) (LRB) with comments: ROBOTIC ASSISTED TOTAL HYSTERECTOMY (N/A) BILATERAL SALPINGECTOMY (Bilateral)  Patient Location: PACU  Anesthesia Type: General  Level of Consciousness: awake, alert  and oriented  Airway & Oxygen Therapy: Patient Spontanous Breathing and Patient connected to nasal cannula oxygen  Post-op Assessment: Report given to PACU RN and Post -op Vital signs reviewed and stable  Post vital signs: Reviewed and stable  Complications: No apparent anesthesia complications

## 2012-04-08 NOTE — Progress Notes (Signed)
Patient ID: [name], female   DOB: [birth year], 46 y.o.   MRN: [id number] Patient seen and examined. Consent witnessed and signed. No changes noted. Update completed.

## 2012-04-08 NOTE — Anesthesia Preprocedure Evaluation (Signed)
Anesthesia Evaluation  Patient identified by MRN, date of birth, ID band Patient awake    Reviewed: Allergy & Precautions, H&P , NPO status , Patient's Chart, lab work & pertinent test results  Airway Mallampati: II TM Distance: >3 FB Neck ROM: full    Dental  (+) Teeth Intact   Pulmonary neg pulmonary ROS,  breath sounds clear to auscultation        Cardiovascular negative cardio ROS  Rhythm:regular Rate:Normal     Neuro/Psych  Headaches, negative psych ROS   GI/Hepatic Neg liver ROS, Medicated and Controlled,  Endo/Other  negative endocrine ROS  Renal/GU negative Renal ROS  negative genitourinary   Musculoskeletal   Abdominal   Peds  Hematology negative hematology ROS (+)   Anesthesia Other Findings   Reproductive/Obstetrics                           Anesthesia Physical Anesthesia Plan  ASA: II  Anesthesia Plan: General ETT   Post-op Pain Management:    Induction:   Airway Management Planned:   Additional Equipment:   Intra-op Plan:   Post-operative Plan:   Informed Consent: I have reviewed the patients History and Physical, chart, labs and discussed the procedure including the risks, benefits and alternatives for the proposed anesthesia with the patient or authorized representative who has indicated his/her understanding and acceptance.   Dental Advisory Given  Plan Discussed with: Anesthesiologist, CRNA and Surgeon  Anesthesia Plan Comments:         Anesthesia Quick Evaluation

## 2012-04-08 NOTE — Anesthesia Postprocedure Evaluation (Signed)
Anesthesia Post Note  Patient: [name]  Procedure(s) Performed: Procedure(s) (LRB): ROBOTIC ASSISTED TOTAL HYSTERECTOMY (N/A) BILATERAL SALPINGECTOMY (Bilateral)  Anesthesia type: General  Patient location: PACU  Post pain: Pain level controlled  Post assessment: Post-op Vital signs reviewed  Last Vitals:  Filed Vitals:   04/08/12 1700  BP: 114/70  Pulse: 92  Temp: 36.5 C  Resp: 12    Post vital signs: Reviewed  Level of consciousness: sedated  Complications: No apparent anesthesia complicationsfj

## 2012-04-09 DIAGNOSIS — Z90710 Acquired absence of both cervix and uterus: Secondary | ICD-10-CM | POA: Diagnosis not present

## 2012-04-09 LAB — BASIC METABOLIC PANEL
BUN: 10 mg/dL (ref 6–23)
Chloride: 104 mEq/L (ref 96–112)
GFR calc Af Amer: >90 mL/min (ref >90)
Glucose, Bld: 140 mg/dL — ABNORMAL HIGH (ref 70–99)
Potassium: 4.2 mEq/L (ref 3.5–5.1)
Sodium: 134 mEq/L — ABNORMAL LOW (ref 135–145)

## 2012-04-09 LAB — CBC
HCT: 38.3 % (ref 36.0–46.0)
Hemoglobin: 13.4 g/dL (ref 12.0–15.0)
MCH: 34.4 pg — ABNORMAL HIGH (ref 26.0–34.0)
MCHC: 35 g/dL (ref 30.0–36.0)
RBC: 3.89 MIL/uL (ref 3.87–5.11)

## 2012-04-09 MED ORDER — TRAMADOL HCL 50 MG PO TABS: 100.0000 mg | ORAL_TABLET | Freq: Four times a day (QID) | ORAL | Status: AC | PRN

## 2012-04-09 MED ORDER — OXYCODONE-ACETAMINOPHEN 5-325 MG PO TABS: 1.0000 | ORAL_TABLET | ORAL | Status: AC | PRN

## 2012-04-09 NOTE — Progress Notes (Signed)
1 Day Post-Op Procedure(s) (LRB): ROBOTIC ASSISTED TOTAL HYSTERECTOMY (N/A) BILATERAL SALPINGECTOMY (Bilateral)  Subjective: Patient reports nausea, incisional pain, + flatus and no problems voiding.    Objective: I have reviewed patient's vital signs, intake and output and labs.  General: alert, cooperative and appears stated age Resp: clear to auscultation bilaterally and normal percussion bilaterally Cardio: regular rate and rhythm, S1, S2 normal, no murmur, click, rub or gallop GI: soft, non-tender; bowel sounds normal; no masses,  no organomegaly Extremities: extremities normal, atraumatic, no cyanosis or edema and Homans sign is negative, no sign of DVT Vaginal Bleeding: minimal Abd incisions clean and dry.   Assessment: s/p Procedure(s) (LRB) with comments: ROBOTIC ASSISTED TOTAL HYSTERECTOMY (N/A) BILATERAL SALPINGECTOMY (Bilateral): stable, progressing well and tolerating diet  Plan: Advance diet Encourage ambulation Discontinue IV fluids Discharge home  LOS: 1 day    Tyquon Near J 04/09/2012, 9:45 AM

## 2012-04-09 NOTE — Addendum Note (Signed)
Addendum  created 04/09/12 1058 by Earmon Phoenix, CRNA   Modules edited:Notes Section

## 2012-04-09 NOTE — Op Note (Signed)
NAMEABIGAILE, [name]               ACCOUNT NO.:  [id number]  MEDICAL RECORD NO.:  [id number]  LOCATION:  9302                          FACILITY:  [facility]  PHYSICIAN:  Lenoard Aden, M.D.DATE OF BIRTH:  September 05, 1965  DATE OF PROCEDURE:  04/08/2012 DATE OF DISCHARGE:                              OPERATIVE REPORT   DESCRIPTION OF PROCEDURE:  After being apprised of risks of anesthesia, infection, bleeding, injury to abdominal organs, need for repair, delayed versus immediate complications include bowel and bladder injury, possible need for repair, the patient was brought to the operating room where she was administered general anesthetic without complications. Prepped and draped in sterile fashion.  Foley catheter placed.  Feet were placed in Yellofin stirrups.  Exam under anesthesia revealed a bulky mid anteflexed uterus and no adnexal masses appreciated.  At this time, the RUMI retractor placed per vagina without difficulty and attention was turned to the abdominal portion procedure whereby an infraumbilical incision made with a scalpel.  Veress needle was placed at opening pressure of -2.  A 5 L of CO2 insufflated without difficulty. Trocar placed atraumatically.  Visualization reveals a normal atraumatic trocar entry, adhesions of the left bowel mesentery to the left adnexa and pelvic sidewall.  Normal anterior cul-de-sac.  Normal posterior cul- de-sac.  Bilateral normal ovaries and previously divided tubes.  Of note, due to the patient's recent gallbladder history, gallbladder area appears normal.  Liver appears normal.  Appendix was not seen.  At this time, 2 ports were made on the right and left, 3 three additional robotic ports were placed and 1 assistant port on the left.  At this time, the robot was docked in the standard fashion.  The PK, ProGrasp and Endo Shears were placed.  The left adnexa was then visualized. Adhesions are taken down, the left adnexa visualizing and  otherwise normal.  Left adnexa within the ureter peristalsing normally bilaterally.  At this time, the segment on the left tube was excised sharply along with base and put in the cul-de-sac and same was done to the right tubal piece that was remaining at this delay from her previous tubal ligation.  At this time, the retroperitoneal space was entered, and the ovarian ligament is skeletonized and divided.  The round ligament was skeletonized and divided. Bladder flap developed sharply. The uterine vessels were skeletonized on the left, cauterized in a 3- point method but not divided.  On the right side, the ureter was noted to be peristalsing normally bilaterally.  The retroperitoneal space was entered on the right.  At this time, the ovarian ligament is skeletonized and divided.  The round ligament is divided. The bladder flap was further developed and the uterine vessels were skeletonized on the right.  Bladder flap was well developed, and the uterine vessels on the right are cauterized in a 3-point method and cut.  The vessels on the left were then cut.  At this time, the cervical vaginal junction was scored using Endoshears and the specimen was retracted in the vagina. The tubes were removed as well separately, but sent in the same specimen. At this time, then irrigation was accomplished.  Good hemostasis was noted.  The vagina was closed using a 0 V-Loc suture in a continuous running fashion with a second imbricating layer, and a culdoplasty suture placed due to recognition of an enterocele.  At the end of the procedure, urine was clear.  A 100 mL of output as noted. Ureters were noted to be peristalsing normally bilaterally.  Good hemostasis was noted.  The robot was undocked.  All instruments were removed under direct visualization.  CO2 was released.  Incisions were closed using 0 Vicryl and a 4-0 Vicryl. Dermabond was placed.  Vaginal exam reveals a well-approximated vaginal  mucosa.  Clear urine was noted.  The patient tolerated the procedure well, was awakened and transferred to recovery room in good condition.     Lenoard Aden, M.D.     RJT/MEDQ  D:  04/08/2012  T:  04/09/2012  Job:  [id number]

## 2012-04-09 NOTE — Anesthesia Postprocedure Evaluation (Signed)
  Anesthesia Post-op Note  Patient: [name]  Procedure(s) Performed: Procedure(s) (LRB) with comments: ROBOTIC ASSISTED TOTAL HYSTERECTOMY (N/A) BILATERAL SALPINGECTOMY (Bilateral)  Patient Location: [facility]  Anesthesia Type: General  Level of Consciousness: awake, alert  and oriented  Airway and Oxygen Therapy: Patient Spontanous Breathing  Post-op Pain: mild  Post-op Assessment: Patient's Cardiovascular Status Stable and Respiratory Function Stable  Post-op Vital Signs: Reviewed and stable  Complications: No apparent anesthesia complications

## 2012-04-09 NOTE — Addendum Note (Signed)
Addendum  created 04/09/12 1058 by Waldine Zenz P Esdras Delair, CRNA   Modules edited:Notes Section    

## 2012-04-09 NOTE — Progress Notes (Signed)
Discharge instructions reviewed with patient and husband.  Both able to "Teach Back" home care and signs/symptoms to report to MD.  No home equipment needed.  Wheelchair to car with staff without incident and discharged home with husband.

## 2012-07-07 ENCOUNTER — Encounter (INDEPENDENT_AMBULATORY_CARE_PROVIDER_SITE_OTHER): Payer: Self-pay | Admitting: Surgery

## 2012-07-11 ENCOUNTER — Encounter (INDEPENDENT_AMBULATORY_CARE_PROVIDER_SITE_OTHER): Payer: Self-pay

## 2012-07-18 ENCOUNTER — Encounter (INDEPENDENT_AMBULATORY_CARE_PROVIDER_SITE_OTHER): Payer: Self-pay | Admitting: Surgery

## 2012-07-18 ENCOUNTER — Ambulatory Visit (INDEPENDENT_AMBULATORY_CARE_PROVIDER_SITE_OTHER): Payer: BC Managed Care – PPO | Admitting: Surgery

## 2012-07-18 VITALS — BP 110/74 | HR 68 | Temp 97.1°F | Resp 16 | Ht 66.0 in | Wt 179.2 lb

## 2012-07-18 DIAGNOSIS — K811 Chronic cholecystitis: Secondary | ICD-10-CM

## 2012-07-18 NOTE — Progress Notes (Signed)
Patient ID: [name], female   DOB: Jul 30, 1965, 46 y.o.   MRN: [id number]  Chief Complaint  Patient presents with  . New Evaluation    eval GB    HPI [name] is a 46 y.o. female.   HPI This is a pleasant female referred by Dr. Loreta Ave for evaluation of abdominal pain and nausea. This has been going on for several years but now has gotten worse over the last several months. It occurs mostly in the mornings. It woke her up for her food as well. The pain is on the right side of the abdomen mostly but she also has abdominal distention and bloating. Bowel movements are normal. Past Medical History  Diagnosis Date  . Chest pain     last chest pain 2009  . 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 Premature ventricular contractions   . Acid reflux disease     no meds-having tests currently  . Headache   . Osteoporosis   . Anxiety   . Anemia     Past Surgical History  Procedure Date  . Tubal ligation   . Abdominal hysterectomy     Family History  Problem Relation Age of Onset  . Other      negative for premature CAD    Social History History  Substance Use Topics  . Smoking status: Never Smoker   . Smokeless tobacco: Never Used  . Alcohol Use: Yes     Comment: occas    No Known Allergies  Current Outpatient Prescriptions  Medication Sig Dispense Refill  . escitalopram (LEXAPRO) 5 MG tablet Take 5 mg by mouth daily.      Marland Kitchen esomeprazole (NEXIUM) 10 MG packet Take 10 mg by mouth daily before breakfast.      . fish oil-omega-3 fatty acids 1000 MG capsule Take 2 g by mouth daily.      . Ginger, Zingiber officinalis, 250 MG CAPS Take by mouth.      . Iron, Ferrous Gluconate, 256 (28 FE) MG TABS Take by mouth.      . Magnesium Hydroxide (MAGNESIA PO) Take by mouth.      . Misc Natural Products (TART CHERRY ADVANCED) CAPS Take by mouth.      . Multiple Vitamin (MULTIVITAMIN WITH MINERALS) TABS Take 1 tablet by mouth daily.         Review of Systems Review of Systems  Constitutional: Negative for fever, chills and unexpected weight change.  HENT: Negative for hearing loss, congestion, sore throat, trouble swallowing and voice change.   Eyes: Negative for visual disturbance.  Respiratory: Negative for cough and wheezing.   Cardiovascular: Negative for chest pain, palpitations and leg swelling.  Gastrointestinal: Positive for nausea, abdominal pain and abdominal distention. Negative for vomiting, diarrhea, constipation, blood in stool and anal bleeding.  Genitourinary: Negative for hematuria, vaginal bleeding and difficulty urinating.  Musculoskeletal: Negative for arthralgias.  Skin: Negative for rash and wound.  Neurological: Negative for seizures, syncope and headaches.  Hematological: Negative for adenopathy. Does not bruise/bleed easily.  Psychiatric/Behavioral: Negative for confusion.    Blood pressure 110/74, pulse 68, temperature 97.1 F (36.2 C), temperature source Temporal, resp. rate 16, height 5\' 6"  (1.676 m), weight 179 lb 3.2 oz (81.285 kg).  Physical Exam Physical Exam  Constitutional: She is oriented to person, place, and time. She appears well-developed and well-nourished. No distress.  HENT:  Head: Normocephalic and atraumatic.  Right Ear: External ear normal.  Left Ear: External ear normal.  Nose: Nose normal.  Mouth/Throat: Oropharynx is clear and moist. No oropharyngeal exudate.  Eyes: Conjunctivae normal are normal. Pupils are equal, round, and reactive to light. Right eye exhibits no discharge. Left eye exhibits no discharge. No scleral icterus.  Neck: Normal range of motion. Neck supple. No tracheal deviation present. No thyromegaly present.  Cardiovascular: Normal rate, regular rhythm, normal heart sounds and intact distal pulses.   No murmur heard. Pulmonary/Chest: Effort normal and breath sounds normal. No respiratory distress. She has no wheezes.  Abdominal: Soft. Bowel sounds  are normal. She exhibits no distension. There is tenderness. There is no rebound and no guarding.  Musculoskeletal: Normal range of motion. She exhibits no edema and no tenderness.  Lymphadenopathy:    She has no cervical adenopathy.  Neurological: She is alert and oriented to person, place, and time.  Skin: Skin is warm and dry. No rash noted. She is not diaphoretic. No erythema.  Psychiatric: Her behavior is normal. Judgment normal.    Data Reviewed I have reviewed her HIDA scan and ultrasound as well as notes from Dr. Loreta Ave  Assessment    Chronic cholecystitis    Plan    I do believe she has mild chronic cholecystitis causing her symptoms. I discussed expected management first going ahead and proceeding with a laparoscopic cholecystectomy. She is here to proceed with surgery. I gave her literature regarding this and discussed the risk. These risks include but are not limited to bleeding, infection, bile duct injury, bile leak, injury to other structures, the chance this may not resolve her symptoms, conversion to an open procedure, et Karie Soda. She understands and wishes to proceed. Surgery will be scheduled.       Rober Skeels A 07/18/2012, 10:37 AM

## 2012-08-02 ENCOUNTER — Telehealth (INDEPENDENT_AMBULATORY_CARE_PROVIDER_SITE_OTHER): Payer: Self-pay | Admitting: General Surgery

## 2012-08-02 NOTE — Telephone Encounter (Signed)
Ms Wenzl called to check on nausea medicine she requested this am/ Dr. Magnus Ivan ordered Zofran per message and I called Zofran 4mg  #20 with 1 refill to [facility], [address] [place]/ [phone] and notified pt/gy

## 2012-08-02 NOTE — Telephone Encounter (Signed)
Call in Zofran 4mg  1 to 2 po q 6 hrs prn.  #20 with 1 refill.  If she gets sicker, will have to admit her for DOW/LDOW dr.

## 2012-08-02 NOTE — Telephone Encounter (Signed)
Actually, she can wait till surgery date.  She is a dr. Loreta Ave gallbladder patient with normal studies

## 2012-08-02 NOTE — Telephone Encounter (Signed)
Patient calling in today - she is scheduled for lap chole on 08/19/12 - stating she is miserable. She is having a lot of bloating and nausea. She has spoken with surgery scheduling who have told her they don't have time to move her surgery sooner. She states she was instructed yesterday to switch to clear liquids for the nausea. She has eaten jello and chicken broth and she is still having a lot of nausea. She is not currently taking anything for the nausea. Please advise.

## 2012-08-05 ENCOUNTER — Encounter (HOSPITAL_COMMUNITY): Payer: Self-pay | Admitting: Pharmacy Technician

## 2012-08-05 ENCOUNTER — Telehealth (INDEPENDENT_AMBULATORY_CARE_PROVIDER_SITE_OTHER): Payer: Self-pay

## 2012-08-05 NOTE — Telephone Encounter (Signed)
Pt called stating she has surgery for GB removal 08-19-12. Pt called ask of fatigue is a symptom of GB disorder. I advised her fatigue can be caused by many things but not usually biliary dyskinesia. Pt states she does not have fever or malnutrition. Pt advised if this symptom continues she will need to have this addressed by her pcp.

## 2012-08-09 NOTE — Pre-Procedure Instructions (Signed)
Stephanee Rennaker  08/09/2012   Your procedure is scheduled on: Friday, January 24TH   Report to [facility] at 5:30 AM.  Call this number if you have problems the morning of surgery: [phone]   Remember:   Do not eat food or drink liquids after midnight, this includes water, tea & coffee.    Take these medicines the morning of surgery with A SIP OF WATER: Nexium, Zofran, Lexapro   Do not wear jewelry, make-up or nail polish.  Do not wear lotions, powders, or perfumes. You may wear NOT deodorant.   Do not shave 48 hours prior to surgery.   Do not bring valuables to the hospital.   Contacts, dentures or bridgework may not be worn into surgery.  Leave suitcase in the car. After surgery it may be brought to your room.  For patients admitted to the hospital, checkout time is 11:00 AM the day of  discharge.   Patients discharged the day of surgery will not be allowed to drive home and will need to have              Someone with you for the first 24 hrs.             Stop taking anti-inflammatories, coumadin, aspirin, plavix, & herbal medications, 5 days prior               Surgery.   Name and phone number of your driver:    Special Instructions: Shower using CHG 2 nights before surgery and the night before surgery.  If you shower the day of surgery use CHG.  Use special wash - you have one bottle of CHG for all showers.  You should use approximately 1/3 of the bottle for each shower.   Please read over the following fact sheets that you were given: Pain Booklet, Coughing and Deep Breathing, MRSA Information and Surgical Site Infection Prevention

## 2012-08-10 ENCOUNTER — Encounter (HOSPITAL_COMMUNITY)
Admission: RE | Admit: 2012-08-10 | Discharge: 2012-08-10 | Disposition: A | Discharge status: Home / Self Care | Payer: BC Managed Care – PPO | Source: Ambulatory Visit | Attending: Surgery | Admitting: Surgery

## 2012-08-10 ENCOUNTER — Encounter (HOSPITAL_COMMUNITY): Payer: Self-pay

## 2012-08-10 LAB — CBC
MCH: 34.2 pg — ABNORMAL HIGH (ref 26.0–34.0)
Platelets: 205 10*3/uL (ref 150–400)
RBC: 4.18 MIL/uL (ref 3.87–5.11)
RDW: 12.3 % (ref 11.5–15.5)
WBC: 8.7 10*3/uL (ref 4.0–10.5)

## 2012-08-10 LAB — SURGICAL PCR SCREEN: MRSA, PCR: NEGATIVE

## 2012-08-10 LAB — HCG, SERUM, QUALITATIVE: Preg, Serum: NEGATIVE

## 2012-08-16 NOTE — H&P (Signed)
Patient ID: [name], female DOB: 11-Aug-1965, 47 y.o. MRN: [id number]  Chief Complaint   Patient presents with   .  New Evaluation     eval GB    HPI  [name] is a 47 y.o. female.  HPI  This is a pleasant female referred by Dr. Loreta Ave for evaluation of abdominal pain and nausea. This has been going on for several years but now has gotten worse over the last several months. It occurs mostly in the mornings. It woke her up for her food as well. The pain is on the right side of the abdomen mostly but she also has abdominal distention and bloating. Bowel movements are normal.  Past Medical History   Diagnosis  Date   .  Chest pain      last chest pain 2009   .  Palpitations      history of PVC's -2 sinus nodes-last echo done-2009   .  Premature ventricular contractions    .  Acid reflux disease      no meds-having tests currently   .  Headache    .  Osteoporosis    .  Anxiety    .  Anemia     Past Surgical History   Procedure  Date   .  Tubal ligation    .  Abdominal hysterectomy     Family History   Problem  Relation  Age of Onset   .  Other        negative for premature CAD    Social History  History   Substance Use Topics   .  Smoking status:  Never Smoker   .  Smokeless tobacco:  Never Used   .  Alcohol Use:  Yes      Comment: occas    No Known Allergies  Current Outpatient Prescriptions   Medication  Sig  Dispense  Refill   .  escitalopram (LEXAPRO) 5 MG tablet  Take 5 mg by mouth daily.     Marland Kitchen  esomeprazole (NEXIUM) 10 MG packet  Take 10 mg by mouth daily before breakfast.     .  fish oil-omega-3 fatty acids 1000 MG capsule  Take 2 g by mouth daily.     .  Ginger, Zingiber officinalis, 250 MG CAPS  Take by mouth.     .  Iron, Ferrous Gluconate, 256 (28 FE) MG TABS  Take by mouth.     .  Magnesium Hydroxide (MAGNESIA PO)  Take by mouth.     .  Misc Natural Products (TART CHERRY ADVANCED) CAPS  Take by mouth.     .  Multiple Vitamin (MULTIVITAMIN WITH  MINERALS) TABS  Take 1 tablet by mouth daily.      Review of Systems  Review of Systems  Constitutional: Negative for fever, chills and unexpected weight change.  HENT: Negative for hearing loss, congestion, sore throat, trouble swallowing and voice change.  Eyes: Negative for visual disturbance.  Respiratory: Negative for cough and wheezing.  Cardiovascular: Negative for chest pain, palpitations and leg swelling.  Gastrointestinal: Positive for nausea, abdominal pain and abdominal distention. Negative for vomiting, diarrhea, constipation, blood in stool and anal bleeding.  Genitourinary: Negative for hematuria, vaginal bleeding and difficulty urinating.  Musculoskeletal: Negative for arthralgias.  Skin: Negative for rash and wound.  Neurological: Negative for seizures, syncope and headaches.  Hematological: Negative for adenopathy. Does not bruise/bleed easily.  Psychiatric/Behavioral: Negative for confusion.   Blood pressure 110/74, pulse 68,  temperature 97.1 F (36.2 C), temperature source Temporal, resp. rate 16, height 5\' 6"  (1.676 m), weight 179 lb 3.2 oz (81.285 kg).  Physical Exam  Physical Exam  Constitutional: She is oriented to person, place, and time. She appears well-developed and well-nourished. No distress.  HENT:  Head: Normocephalic and atraumatic.  Right Ear: External ear normal.  Left Ear: External ear normal.  Nose: Nose normal.  Mouth/Throat: Oropharynx is clear and moist. No oropharyngeal exudate.  Eyes: Conjunctivae normal are normal. Pupils are equal, round, and reactive to light. Right eye exhibits no discharge. Left eye exhibits no discharge. No scleral icterus.  Neck: Normal range of motion. Neck supple. No tracheal deviation present. No thyromegaly present.  Cardiovascular: Normal rate, regular rhythm, normal heart sounds and intact distal pulses.  No murmur heard.  Pulmonary/Chest: Effort normal and breath sounds normal. No respiratory distress. She has no  wheezes.  Abdominal: Soft. Bowel sounds are normal. She exhibits no distension. There is tenderness. There is no rebound and no guarding.  Musculoskeletal: Normal range of motion. She exhibits no edema and no tenderness.  Lymphadenopathy:  She has no cervical adenopathy.  Neurological: She is alert and oriented to person, place, and time.  Skin: Skin is warm and dry. No rash noted. She is not diaphoretic. No erythema.  Psychiatric: Her behavior is normal. Judgment normal.   Data Reviewed  I have reviewed her HIDA scan and ultrasound as well as notes from Dr. Loreta Ave  Assessment   Chronic cholecystitis   Plan   I do believe she has mild chronic cholecystitis causing her symptoms. I discussed expected management first going ahead and proceeding with a laparoscopic cholecystectomy. She is here to proceed with surgery. I gave her literature regarding this and discussed the risk. These risks include but are not limited to bleeding, infection, bile duct injury, bile leak, injury to other structures, the chance this may not resolve her symptoms, conversion to an open procedure, et Karie Soda. She understands and wishes to proceed. Surgery will be scheduled.

## 2012-08-17 ENCOUNTER — Telehealth (INDEPENDENT_AMBULATORY_CARE_PROVIDER_SITE_OTHER): Payer: Self-pay | Admitting: General Surgery

## 2012-08-17 NOTE — Telephone Encounter (Signed)
Pt called back with update.  She took ibuprofen 600 mg at 10:00 this morning, with no relief.  She asks if Tramadol is OK to try.  Discussed the need to wait a full 6 hours, as both are NSAIDs, but OK to use it.  She states she does not have a PCP to call with this problem.  Instructed her to consider going to ER for treatment if the HA is worsening.  She understands.

## 2012-08-17 NOTE — Telephone Encounter (Signed)
Pt called for advice about headache which began Monday night.  It involves her entire head, but is especially bad behind her Rt eye.  Because she has surgery scheduled for Friday, she is limited to only Tylenol and it helps only marginally.  She has drastically increased her fluid intake as well.  Paged Dr. Magnus Ivan for advice:  He recommends she take ibuprofen and contact her PCP.  Pt will take 600 mg ibuprofen Q6H or 800 mg Q8H.  If no improvement will call her PCP.

## 2012-08-18 MED ORDER — CEFAZOLIN SODIUM-DEXTROSE 2-3 GM-% IV SOLR
2.0000 g | INTRAVENOUS | Status: AC
  Administered 2012-08-19: 2 g via INTRAVENOUS
  Filled 2012-08-18: qty 50

## 2012-08-19 ENCOUNTER — Telehealth (INDEPENDENT_AMBULATORY_CARE_PROVIDER_SITE_OTHER): Payer: Self-pay

## 2012-08-19 ENCOUNTER — Encounter (HOSPITAL_COMMUNITY): Payer: Self-pay | Admitting: *Deleted

## 2012-08-19 ENCOUNTER — Encounter (HOSPITAL_COMMUNITY): Payer: Self-pay | Admitting: Anesthesiology

## 2012-08-19 ENCOUNTER — Ambulatory Visit (HOSPITAL_COMMUNITY)
Admission: RE | Admit: 2012-08-19 | Discharge: 2012-08-19 | Disposition: A | Discharge status: Home / Self Care | Payer: BC Managed Care – PPO | Source: Ambulatory Visit | Attending: Surgery | Admitting: Surgery

## 2012-08-19 ENCOUNTER — Encounter (HOSPITAL_COMMUNITY)
Admission: RE | Disposition: A | Discharge status: Home / Self Care | Payer: Self-pay | Source: Ambulatory Visit | Attending: Surgery

## 2012-08-19 ENCOUNTER — Ambulatory Visit (HOSPITAL_COMMUNITY): Payer: BC Managed Care – PPO | Admitting: Anesthesiology

## 2012-08-19 DIAGNOSIS — G8918 Other acute postprocedural pain: Secondary | ICD-10-CM

## 2012-08-19 DIAGNOSIS — K811 Chronic cholecystitis: Secondary | ICD-10-CM | POA: Insufficient documentation

## 2012-08-19 DIAGNOSIS — Z01812 Encounter for preprocedural laboratory examination: Secondary | ICD-10-CM | POA: Insufficient documentation

## 2012-08-19 DIAGNOSIS — K219 Gastro-esophageal reflux disease without esophagitis: Secondary | ICD-10-CM | POA: Insufficient documentation

## 2012-08-19 HISTORY — PX: CHOLECYSTECTOMY: SHX55

## 2012-08-19 SURGERY — LAPAROSCOPIC CHOLECYSTECTOMY
Anesthesia: General | Site: Abdomen | Laterality: Bilateral | Wound class: Clean Contaminated

## 2012-08-19 MED ORDER — PROMETHAZINE HCL 25 MG/ML IJ SOLN
INTRAMUSCULAR | Status: AC
  Filled 2012-08-19: qty 1

## 2012-08-19 MED ORDER — HYDROMORPHONE HCL PF 1 MG/ML IJ SOLN: 0.5000 mg | INTRAMUSCULAR | Status: DC | PRN

## 2012-08-19 MED ORDER — SODIUM CHLORIDE 0.9 % IJ SOLN: 3.0000 mL | INTRAMUSCULAR | Status: DC | PRN

## 2012-08-19 MED ORDER — PROMETHAZINE HCL 25 MG/ML IJ SOLN
6.2500 mg | INTRAMUSCULAR | Status: DC | PRN
  Administered 2012-08-19: 12.5 mg via INTRAVENOUS

## 2012-08-19 MED ORDER — FENTANYL CITRATE 0.05 MG/ML IJ SOLN
INTRAMUSCULAR | Status: DC | PRN
  Administered 2012-08-19 (×3): 50 ug via INTRAVENOUS
  Administered 2012-08-19: 100 ug via INTRAVENOUS

## 2012-08-19 MED ORDER — OXYCODONE HCL 5 MG PO TABS
5.0000 mg | ORAL_TABLET | Freq: Once | ORAL | Status: AC | PRN
  Administered 2012-08-19: 5 mg via ORAL

## 2012-08-19 MED ORDER — MORPHINE SULFATE 4 MG/ML IJ SOLN: 4.0000 mg | INTRAMUSCULAR | Status: DC | PRN

## 2012-08-19 MED ORDER — HYDROCODONE-ACETAMINOPHEN 5-325 MG PO TABS: 1.0000 | ORAL_TABLET | ORAL | Status: AC | PRN

## 2012-08-19 MED ORDER — OXYCODONE HCL 5 MG PO TABS
ORAL_TABLET | ORAL | Status: AC
  Filled 2012-08-19: qty 1

## 2012-08-19 MED ORDER — SUCCINYLCHOLINE CHLORIDE 20 MG/ML IJ SOLN
INTRAMUSCULAR | Status: DC | PRN
  Administered 2012-08-19: 100 mg via INTRAVENOUS

## 2012-08-19 MED ORDER — ACETAMINOPHEN 325 MG PO TABS: 650.0000 mg | ORAL_TABLET | ORAL | Status: DC | PRN

## 2012-08-19 MED ORDER — MIDAZOLAM HCL 5 MG/5ML IJ SOLN
INTRAMUSCULAR | Status: DC | PRN
  Administered 2012-08-19: 2 mg via INTRAVENOUS

## 2012-08-19 MED ORDER — HYDROMORPHONE HCL PF 1 MG/ML IJ SOLN
0.2500 mg | INTRAMUSCULAR | Status: DC | PRN
  Administered 2012-08-19 (×4): 0.5 mg via INTRAVENOUS

## 2012-08-19 MED ORDER — ACETAMINOPHEN 10 MG/ML IV SOLN
1000.0000 mg | Freq: Once | INTRAVENOUS | Status: AC
  Administered 2012-08-19: 1000 mg via INTRAVENOUS

## 2012-08-19 MED ORDER — ROCURONIUM BROMIDE 100 MG/10ML IV SOLN
INTRAVENOUS | Status: DC | PRN
  Administered 2012-08-19: 20 mg via INTRAVENOUS

## 2012-08-19 MED ORDER — OXYCODONE HCL 5 MG PO TABS: 5.0000 mg | ORAL_TABLET | ORAL | Status: DC | PRN

## 2012-08-19 MED ORDER — HYDROMORPHONE HCL PF 1 MG/ML IJ SOLN
INTRAMUSCULAR | Status: AC
  Filled 2012-08-19: qty 1

## 2012-08-19 MED ORDER — ONDANSETRON HCL 4 MG/2ML IJ SOLN
4.0000 mg | Freq: Four times a day (QID) | INTRAMUSCULAR | Status: DC | PRN
  Administered 2012-08-19: 4 mg via INTRAVENOUS

## 2012-08-19 MED ORDER — OXYCODONE HCL 5 MG/5ML PO SOLN: 5.0000 mg | Freq: Once | ORAL | Status: AC | PRN

## 2012-08-19 MED ORDER — BUPIVACAINE-EPINEPHRINE 0.25% -1:200000 IJ SOLN
INTRAMUSCULAR | Status: DC | PRN
  Administered 2012-08-19: 10 mL

## 2012-08-19 MED ORDER — MIDAZOLAM HCL 2 MG/2ML IJ SOLN
0.5000 mg | Freq: Once | INTRAMUSCULAR | Status: AC | PRN
  Administered 2012-08-19: 1 mg via INTRAVENOUS

## 2012-08-19 MED ORDER — LACTATED RINGERS IV SOLN
INTRAVENOUS | Status: DC | PRN
  Administered 2012-08-19: 07:00:00 via INTRAVENOUS

## 2012-08-19 MED ORDER — NEOSTIGMINE METHYLSULFATE 1 MG/ML IJ SOLN
INTRAMUSCULAR | Status: DC | PRN
  Administered 2012-08-19: 2 mg via INTRAVENOUS

## 2012-08-19 MED ORDER — ONDANSETRON HCL 4 MG/2ML IJ SOLN
INTRAMUSCULAR | Status: DC | PRN
  Administered 2012-08-19: 4 mg via INTRAVENOUS

## 2012-08-19 MED ORDER — SODIUM CHLORIDE 0.9 % IJ SOLN: 3.0000 mL | Freq: Two times a day (BID) | INTRAMUSCULAR | Status: DC

## 2012-08-19 MED ORDER — KETOROLAC TROMETHAMINE 30 MG/ML IJ SOLN
INTRAMUSCULAR | Status: DC | PRN
  Administered 2012-08-19: 30 mg via INTRAVENOUS

## 2012-08-19 MED ORDER — OXYCODONE-ACETAMINOPHEN 5-325 MG PO TABS: 1.0000 | ORAL_TABLET | ORAL | Status: AC | PRN

## 2012-08-19 MED ORDER — MIDAZOLAM HCL 2 MG/2ML IJ SOLN
INTRAMUSCULAR | Status: AC
  Filled 2012-08-19: qty 2

## 2012-08-19 MED ORDER — ACETAMINOPHEN 10 MG/ML IV SOLN
INTRAVENOUS | Status: AC
  Filled 2012-08-19: qty 100

## 2012-08-19 MED ORDER — ONDANSETRON HCL 4 MG/2ML IJ SOLN
INTRAMUSCULAR | Status: AC
  Filled 2012-08-19: qty 2

## 2012-08-19 MED ORDER — ACETAMINOPHEN 650 MG RE SUPP: 650.0000 mg | RECTAL | Status: DC | PRN

## 2012-08-19 MED ORDER — LIDOCAINE HCL (CARDIAC) 20 MG/ML IV SOLN
INTRAVENOUS | Status: DC | PRN
  Administered 2012-08-19: 20 mg via INTRAVENOUS

## 2012-08-19 MED ORDER — HYDROMORPHONE HCL PF 1 MG/ML IJ SOLN
INTRAMUSCULAR | Status: DC | PRN
Start: 2012-08-19 — End: 2012-08-19
  Administered 2012-08-19: 1 mg via INTRAVENOUS

## 2012-08-19 MED ORDER — GLYCOPYRROLATE 0.2 MG/ML IJ SOLN
INTRAMUSCULAR | Status: DC | PRN
  Administered 2012-08-19 (×3): 0.2 mg via INTRAVENOUS

## 2012-08-19 MED ORDER — MEPERIDINE HCL 25 MG/ML IJ SOLN: 6.2500 mg | INTRAMUSCULAR | Status: DC | PRN

## 2012-08-19 MED ORDER — BUPIVACAINE-EPINEPHRINE PF 0.25-1:200000 % IJ SOLN
INTRAMUSCULAR | Status: AC
  Filled 2012-08-19: qty 30

## 2012-08-19 MED ORDER — PROPOFOL 10 MG/ML IV BOLUS
INTRAVENOUS | Status: DC | PRN
  Administered 2012-08-19: 200 mg via INTRAVENOUS

## 2012-08-19 MED ORDER — PROMETHAZINE HCL 25 MG/ML IJ SOLN
12.5000 mg | Freq: Once | INTRAMUSCULAR | Status: AC
  Administered 2012-08-19: 13:00:00 via INTRAVENOUS

## 2012-08-19 MED ORDER — SODIUM CHLORIDE 0.9 % IR SOLN
Status: DC | PRN
  Administered 2012-08-19: 1000 mL

## 2012-08-19 MED ORDER — SODIUM CHLORIDE 0.9 % IV SOLN: 250.0000 mL | INTRAVENOUS | Status: DC | PRN

## 2012-08-19 SURGICAL SUPPLY — 35 items
APL SKNCLS STERI-STRIP NONHPOA (GAUZE/BANDAGES/DRESSINGS) ×1
APPLIER CLIP 5 13 M/L LIGAMAX5 (MISCELLANEOUS) ×2
BAG SPEC RTRVL LRG 6X4 10 (ENDOMECHANICALS)
BANDAGE ADHESIVE 1X3 (GAUZE/BANDAGES/DRESSINGS) ×8 IMPLANT
BENZOIN TINCTURE PRP APPL 2/3 (GAUZE/BANDAGES/DRESSINGS) ×2 IMPLANT
CANISTER SUCTION 2500CC (MISCELLANEOUS) ×2 IMPLANT
CHLORAPREP W/TINT 26ML (MISCELLANEOUS) ×2 IMPLANT
CLIP APPLIE 5 13 M/L LIGAMAX5 (MISCELLANEOUS) ×1 IMPLANT
CLOTH BEACON ORANGE TIMEOUT ST (SAFETY) ×2 IMPLANT
COVER MAYO STAND STRL (DRAPES) IMPLANT
COVER SURGICAL LIGHT HANDLE (MISCELLANEOUS) ×2 IMPLANT
DECANTER SPIKE VIAL GLASS SM (MISCELLANEOUS) ×2 IMPLANT
DRAPE C-ARM 42X72 X-RAY (DRAPES) IMPLANT
ELECT REM PT RETURN 9FT ADLT (ELECTROSURGICAL) ×2
ELECTRODE REM PT RTRN 9FT ADLT (ELECTROSURGICAL) ×1 IMPLANT
GLOVE SURG SIGNA 7.5 PF LTX (GLOVE) ×2 IMPLANT
GOWN PREVENTION PLUS XLARGE (GOWN DISPOSABLE) ×2 IMPLANT
GOWN STRL NON-REIN LRG LVL3 (GOWN DISPOSABLE) ×6 IMPLANT
KIT BASIN OR (CUSTOM PROCEDURE TRAY) ×2 IMPLANT
KIT ROOM TURNOVER OR (KITS) ×2 IMPLANT
NS IRRIG 1000ML POUR BTL (IV SOLUTION) ×2 IMPLANT
PAD ARMBOARD 7.5X6 YLW CONV (MISCELLANEOUS) ×4 IMPLANT
POUCH SPECIMEN RETRIEVAL 10MM (ENDOMECHANICALS) IMPLANT
SCISSORS LAP 5X35 DISP (ENDOMECHANICALS) IMPLANT
SET CHOLANGIOGRAPH 5 50 .035 (SET/KITS/TRAYS/PACK) IMPLANT
SET IRRIG TUBING LAPAROSCOPIC (IRRIGATION / IRRIGATOR) ×2 IMPLANT
SLEEVE ENDOPATH XCEL 5M (ENDOMECHANICALS) ×4 IMPLANT
SPECIMEN JAR SMALL (MISCELLANEOUS) ×2 IMPLANT
SUT MON AB 4-0 PC3 18 (SUTURE) ×2 IMPLANT
TOWEL OR 17X24 6PK STRL BLUE (TOWEL DISPOSABLE) ×2 IMPLANT
TOWEL OR 17X26 10 PK STRL BLUE (TOWEL DISPOSABLE) ×2 IMPLANT
TRAY LAPAROSCOPIC (CUSTOM PROCEDURE TRAY) ×2 IMPLANT
TROCAR XCEL BLUNT TIP 100MML (ENDOMECHANICALS) ×2 IMPLANT
TROCAR XCEL NON-BLD 5MMX100MML (ENDOMECHANICALS) ×2 IMPLANT
WATER STERILE IRR 1000ML POUR (IV SOLUTION) IMPLANT

## 2012-08-19 NOTE — Anesthesia Postprocedure Evaluation (Signed)
  Anesthesia Post-op Note  Patient: [name]  Procedure(s) Performed: Procedure(s) (LRB) with comments: LAPAROSCOPIC CHOLECYSTECTOMY (Bilateral) - laparoscopic cholecystectomy  Patient Location: PACU  Anesthesia Type:General  Level of Consciousness: awake, alert , oriented and patient cooperative  Airway and Oxygen Therapy: Patient Spontanous Breathing  Post-op Pain: mild  Post-op Assessment: Post-op Vital signs reviewed, Patient's Cardiovascular Status Stable, Respiratory Function Stable, Patent Airway, No signs of Nausea or vomiting and Pain level controlled  Post-op Vital Signs: Reviewed and stable  Complications: No apparent anesthesia complications

## 2012-08-19 NOTE — Anesthesia Preprocedure Evaluation (Signed)
Anesthesia Evaluation  Patient identified by MRN, date of birth, ID band Patient awake    Reviewed: Allergy & Precautions, H&P , NPO status , Patient's Chart, lab work & pertinent test results  History of Anesthesia Complications Negative for: history of anesthetic complications  Airway Mallampati: II TM Distance: >3 FB Neck ROM: Full    Dental No notable dental hx. (+) Teeth Intact and Dental Advisory Given   Pulmonary neg pulmonary ROS,  breath sounds clear to auscultation  Pulmonary exam normal       Cardiovascular + dysrhythmias (benign PVCs) Rhythm:Regular Rate:Normal  '08 Stress myoview: normal '08 ECHO: normal LVF, normal valves   Neuro/Psych negative neurological ROS  negative psych ROS   GI/Hepatic Neg liver ROS, GERD-  Medicated and Controlled,  Endo/Other  Morbid obesity  Renal/GU negative Renal ROS     Musculoskeletal   Abdominal (+) + obese,   Peds  Hematology   Anesthesia Other Findings   Reproductive/Obstetrics                           Anesthesia Physical Anesthesia Plan  ASA: II  Anesthesia Plan: General   Post-op Pain Management:    Induction: Intravenous  Airway Management Planned: Oral ETT  Additional Equipment:   Intra-op Plan:   Post-operative Plan: Extubation in OR  Informed Consent: I have reviewed the patients History and Physical, chart, labs and discussed the procedure including the risks, benefits and alternatives for the proposed anesthesia with the patient or authorized representative who has indicated his/her understanding and acceptance.   Dental advisory given  Plan Discussed with: CRNA and Surgeon  Anesthesia Plan Comments: (Plan routine monitors, GETA)        Anesthesia Quick Evaluation

## 2012-08-19 NOTE — Telephone Encounter (Signed)
Dr Magnus Ivan called and gave verbal order for percocet 5/325 # 40 and said to have one of his partners sign the prescription for him.

## 2012-08-19 NOTE — Anesthesia Procedure Notes (Signed)
Procedure Name: Intubation Date/Time: 08/19/2012 7:32 AM Performed by: Arlice Colt B Pre-anesthesia Checklist: Patient identified, Emergency Drugs available, Suction available, Patient being monitored and Timeout performed Patient Re-evaluated:Patient Re-evaluated prior to inductionOxygen Delivery Method: Circle system utilized Preoxygenation: Pre-oxygenation with 100% oxygen Intubation Type: IV induction and Rapid sequence Laryngoscope Size: Mac and 4 Grade View: Grade I Tube type: Oral Tube size: 7.0 mm Number of attempts: 1 Airway Equipment and Method: Stylet Placement Confirmation: ETT inserted through vocal cords under direct vision,  positive ETCO2 and breath sounds checked- equal and bilateral Secured at: 20 cm Tube secured with: Tape Dental Injury: Teeth and Oropharynx as per pre-operative assessment

## 2012-08-19 NOTE — Progress Notes (Signed)
UP AND WALKED AND TOL WELL VOIDED; NO FURTHER NAUSEA; NO C/O PAIN

## 2012-08-19 NOTE — Progress Notes (Signed)
Pt cont to c/o of 10/10 pain scale.I spoke with the patient about this. With dr Jean Rosenthal new orders obtained and carried out

## 2012-08-19 NOTE — Op Note (Signed)

## 2012-08-19 NOTE — Interval H&P Note (Signed)
History and Physical Interval Note: no change in H and P  08/19/2012 6:58 AM  [name]  has presented today for surgery, with the diagnosis of chronic cholecystitis  The various methods of treatment have been discussed with the patient and family. After consideration of risks, benefits and other options for treatment, the patient has consented to  Procedure(s) (LRB) with comments: LAPAROSCOPIC CHOLECYSTECTOMY (N/A) - laparoscopic cholecystectomy as a surgical intervention .  The patient's history has been reviewed, patient examined, no change in status, stable for surgery.  I have reviewed the patient's chart and labs.  Questions were answered to the patient's satisfaction.     Berdie Malter A

## 2012-08-19 NOTE — Transfer of Care (Signed)
Immediate Anesthesia Transfer of Care Note  Patient: [name]  Procedure(s) Performed: Procedure(s) (LRB) with comments: LAPAROSCOPIC CHOLECYSTECTOMY (Bilateral) - laparoscopic cholecystectomy  Patient Location: PACU  Anesthesia Type:General  Level of Consciousness: awake, alert  and oriented  Airway & Oxygen Therapy: Patient Spontanous Breathing  Post-op Assessment: Report given to PACU RN and Post -op Vital signs reviewed and stable  Post vital signs: Reviewed and stable  Complications: No apparent anesthesia complications

## 2012-08-22 ENCOUNTER — Encounter (HOSPITAL_COMMUNITY): Payer: Self-pay | Admitting: Surgery

## 2012-08-27 ENCOUNTER — Telehealth (INDEPENDENT_AMBULATORY_CARE_PROVIDER_SITE_OTHER): Payer: Self-pay | Admitting: General Surgery

## 2012-08-27 NOTE — Telephone Encounter (Signed)
She underwent a laparoscopic cholecystectomy on 08/19/12.  She has chronic constipation and has been having more problems since surgery despite taking Miralax.  I recommended Dulcolax tablets and fleets enemas.  If this does not work and she has significant pain, I recommended she go to the ED and be checked for an impaction.

## 2012-08-29 ENCOUNTER — Telehealth (INDEPENDENT_AMBULATORY_CARE_PROVIDER_SITE_OTHER): Payer: Self-pay

## 2012-08-29 NOTE — Telephone Encounter (Signed)
Pt called stating she has not had BM since 08-19-12 and she is becoming nauseated and having headaches.  She states she has tried miralax and increased fluids. She spoke with MD on call last night and tried ducolax and enema with no result. He advised her to go to ER if no BM. I reviewed this by phone with Dr Magnus Ivan. Per Dr Eliberto Ivory recommendation pt advised to go to ER for possible disimpaction. Pt states she understands and will go now.

## 2012-09-08 ENCOUNTER — Encounter (INDEPENDENT_AMBULATORY_CARE_PROVIDER_SITE_OTHER): Payer: BC Managed Care – PPO | Admitting: Surgery

## 2012-09-09 ENCOUNTER — Encounter (INDEPENDENT_AMBULATORY_CARE_PROVIDER_SITE_OTHER): Payer: Self-pay | Admitting: Surgery

## 2012-09-09 ENCOUNTER — Ambulatory Visit (INDEPENDENT_AMBULATORY_CARE_PROVIDER_SITE_OTHER): Payer: BC Managed Care – PPO | Admitting: Surgery

## 2012-09-09 VITALS — BP 104/72 | HR 62 | Temp 97.9°F | Resp 14 | Ht 66.0 in | Wt 182.2 lb

## 2012-09-09 DIAGNOSIS — G8918 Other acute postprocedural pain: Secondary | ICD-10-CM

## 2012-09-09 NOTE — Progress Notes (Signed)
Subjective:     Patient ID: [name], female   DOB: Feb 04, 1966, 47 y.o.   MRN: [id number]  HPI She is here for her first postop visit status post laparoscopic cholecystectomy. All of her symptoms are about the same with minimal change. She is still having significant constipation  Review of Systems     Objective:   Physical Exam On exam, her incisions are well-healed. The final pathology showed mild chronic cholecystitis    Assessment:     Patient status post laparoscopic cholecystectomy     Plan:     Because her symptoms have not resolved, I believe she needs to be referred back to Dr. Loreta Ave for consideration of colonoscopy and further workup. From a general surgical standpoint, I have nothing further to offer. She may resume normal activity. I will see her back as needed

## 2016-06-24 ENCOUNTER — Emergency Department (HOSPITAL_COMMUNITY)
Admission: EM | Admit: 2016-06-24 | Discharge: 2016-06-24 | Disposition: A | Discharge status: Home / Self Care | Payer: Self-pay | Attending: Emergency Medicine | Admitting: Emergency Medicine

## 2016-06-24 ENCOUNTER — Encounter (HOSPITAL_COMMUNITY): Payer: Self-pay | Admitting: Emergency Medicine

## 2016-06-24 DIAGNOSIS — H6691 Otitis media, unspecified, right ear: Secondary | ICD-10-CM | POA: Insufficient documentation

## 2016-06-24 MED ORDER — AMOXICILLIN-POT CLAVULANATE 875-125 MG PO TABS: 1.0000 | ORAL_TABLET | Freq: Two times a day (BID) | ORAL | 0 refills | Status: AC

## 2016-06-24 MED ORDER — IBUPROFEN 800 MG PO TABS: 800.0000 mg | ORAL_TABLET | Freq: Three times a day (TID) | ORAL | 0 refills | Status: AC

## 2016-06-24 NOTE — ED Notes (Signed)
Patient understood discharge instructions and follow up care.  

## 2016-06-24 NOTE — ED Triage Notes (Signed)
Pt has been having cold symptoms since Sunday; reports cough, sore throat, and congestion; states right ear pain worsened last night

## 2016-06-24 NOTE — ED Provider Notes (Signed)
[facility] DEPT Provider Note   CSN: [id number] Arrival date & time: 06/24/16  [id number]     History   Chief Complaint Chief Complaint  Patient presents with  . URI    HPI [name] is a 50 y.o. female.  The history is provided by the patient. No language interpreter was used.  URI   This is a new problem. The current episode started more than 2 days ago. The problem has not changed since onset.There has been no fever. Associated symptoms include sore throat. She has tried nothing for the symptoms. The treatment provided no relief.   Pt complains of pain to her right ear.  Past Medical History:  Diagnosis Date  . Acid reflux disease    no meds-having tests currently  . Anxiety   . Chest pain    last chest pain 2009  . Headache(784.0)   . Osteoporosis   . Palpitations    history of PVC's -2 sinus nodes-last echo done-2009  . Premature ventricular contractions     Patient Active Problem List   Diagnosis Date Noted  . S/P hysterectomy - [facility] 04/09/2012  . Anemia 05/21/2011  . PREMATURE VENTRICULAR CONTRACTIONS 06/30/2010  . ACID REFLUX DISEASE 06/30/2010  . PALPITATIONS 06/30/2010  . CHEST PAIN, ATYPICAL 06/30/2010    Past Surgical History:  Procedure Laterality Date  . ABDOMINAL HYSTERECTOMY    . CHOLECYSTECTOMY  08/19/2012   Procedure: LAPAROSCOPIC CHOLECYSTECTOMY;  Surgeon: Harl Bowie, MD;  Location: [facility];  Service: General;  Laterality: Bilateral;  laparoscopic cholecystectomy  . TUBAL LIGATION      OB History    No data available       Home Medications    Prior to Admission medications   Medication Sig Start Date End Date Taking? Authorizing Provider  amoxicillin-clavulanate (AUGMENTIN) 875-125 MG tablet Take 1 tablet by mouth every 12 (twelve) hours. 06/24/16   Fransico Meadow, PA-C  Cholecalciferol (VITAMIN D PO) Take 1 tablet by mouth daily.    Historical Provider, MD  escitalopram (LEXAPRO) 5 MG tablet Take 5 mg by mouth daily.     Historical Provider, MD  esomeprazole (NEXIUM) 10 MG packet Take 10 mg by mouth daily before breakfast.    Historical Provider, MD  Flaxseed, Linseed, POWD Take 15 mLs by mouth daily.    Historical Provider, MD  Ginger, Zingiber officinalis, (GINGER PO) Take 0.616 mLs by mouth daily. Patient states she takes 1/8 of a teaspoon    Historical Provider, MD  Ginkgo Biloba (GINKGO PO) Take 2 tablets by mouth daily.    Historical Provider, MD  HYDROcodone-acetaminophen (NORCO) 5-325 MG per tablet Take 1-2 tablets by mouth every 4 (four) hours as needed for pain. 08/19/12   Coralie Keens, MD  ibuprofen (ADVIL,MOTRIN) 800 MG tablet Take 1 tablet (800 mg total) by mouth 3 (three) times daily. 06/24/16   Fransico Meadow, PA-C  Iron, Ferrous Gluconate, 256 (28 FE) MG TABS Take by mouth.    Historical Provider, MD  Magnesium Hydroxide (MAGNESIA PO) Take 2 tablets by mouth daily.     Historical Provider, MD  Methylsulfonylmethane (MSM PO) Take 0.616 mLs by mouth daily. Patient takes 1/8 of a teaspoon    Historical Provider, MD  Misc Natural Products (TART CHERRY ADVANCED) CAPS Take 1 capsule by mouth daily.     Historical Provider, MD  Multiple Vitamin (MULTIVITAMIN WITH MINERALS) TABS Take 1 tablet by mouth daily.    Historical Provider, MD  Omega-3 Fatty Acids (FISH OIL  PO) Take 15 mLs by mouth daily.    Historical Provider, MD  ondansetron (ZOFRAN) 4 MG tablet Take 4 mg by mouth every 8 (eight) hours as needed. For nausea    Historical Provider, MD  Turmeric POWD 0.616 mLs by Does not apply route daily.    Historical Provider, MD    Family History Family History  Problem Relation Age of Onset  . Other      negative for premature CAD    Social History Social History  Substance Use Topics  . Smoking status: Never Smoker  . Smokeless tobacco: Never Used  . Alcohol use Yes     Comment: occas     Allergies   Patient has no known allergies.   Review of Systems Review of Systems  HENT:  Positive for sore throat.   All other systems reviewed and are negative.    Physical Exam Updated Vital Signs BP 135/89 (BP Location: Left Arm)   Pulse 71   Temp 97.6 F (36.4 C) (Oral)   Resp 16   Ht 5\' 6"  (1.676 m)   Wt 81.2 kg   LMP 04/08/2012   SpO2 98%   BMI 28.89 kg/m   Physical Exam  Constitutional: She appears well-developed and well-nourished.  HENT:  Head: Normocephalic and atraumatic.  Mouth/Throat: Oropharynx is clear and moist.  Bulging right tm,  Swollen,  Looks bullous  Eyes: EOM are normal. Pupils are equal, round, and reactive to light.  Neck: Normal range of motion. Neck supple.  Cardiovascular: Normal rate and regular rhythm.   Pulmonary/Chest: Effort normal.  Abdominal: Soft.  Musculoskeletal: Normal range of motion.  Neurological: She is alert.  Skin: Skin is warm.  Psychiatric: She has a normal mood and affect.  Nursing note and vitals reviewed.    ED Treatments / Results  Labs (all labs ordered are listed, but only abnormal results are displayed) Labs Reviewed - No data to display  EKG  EKG Interpretation None       Radiology No results found.  Procedures Procedures (including critical care time)  Medications Ordered in ED Medications - No data to display   Initial Impression / Assessment and Plan / ED Course  I have reviewed the triage vital signs and the nursing notes.  Pertinent labs & imaging results that were available during my care of the patient were reviewed by me and considered in my medical decision making (see chart for details).  Clinical Course      Final Clinical Impressions(s) / ED Diagnoses   Final diagnoses:  Right otitis media, unspecified otitis media type    New Prescriptions New Prescriptions   AMOXICILLIN-CLAVULANATE (AUGMENTIN) 875-125 MG TABLET    Take 1 tablet by mouth every 12 (twelve) hours.   IBUPROFEN (ADVIL,MOTRIN) 800 MG TABLET    Take 1 tablet (800 mg total) by mouth 3 (three) times  daily.     Hollace Kinnier [place], PA-C 06/24/16 [id number]    Everlene Balls, MD 06/24/16 [phone]

## 2017-03-05 ENCOUNTER — Emergency Department (HOSPITAL_COMMUNITY)
Admission: EM | Admit: 2017-03-05 | Discharge: 2017-03-05 | Disposition: A | Discharge status: Home / Self Care | Payer: Self-pay | Attending: Emergency Medicine | Admitting: Emergency Medicine

## 2017-03-05 DIAGNOSIS — G8918 Other acute postprocedural pain: Secondary | ICD-10-CM | POA: Insufficient documentation

## 2017-03-05 DIAGNOSIS — Z79899 Other long term (current) drug therapy: Secondary | ICD-10-CM | POA: Insufficient documentation

## 2017-03-05 LAB — CBC WITH DIFFERENTIAL/PLATELET
BASOS ABS: 0 10*3/uL (ref 0.0–0.1)
BASOS PCT: 0 %
EOS ABS: 0.1 10*3/uL (ref 0.0–0.7)
EOS PCT: 1 %
HCT: 37.8 % (ref 36.0–46.0)
Hemoglobin: 12.9 g/dL (ref 12.0–15.0)
Lymphocytes Relative: 26 %
Lymphs Abs: 2.2 10*3/uL (ref 0.7–4.0)
MCH: 33.5 pg (ref 26.0–34.0)
MCHC: 34.1 g/dL (ref 30.0–36.0)
MCV: 98.2 fL (ref 78.0–100.0)
Monocytes Absolute: 0.6 10*3/uL (ref 0.1–1.0)
Monocytes Relative: 8 %
Neutro Abs: 5.5 10*3/uL (ref 1.7–7.7)
Neutrophils Relative %: 65 %
PLATELETS: 327 10*3/uL (ref 150–400)
RBC: 3.85 MIL/uL — ABNORMAL LOW (ref 3.87–5.11)
RDW: 13 % (ref 11.5–15.5)
WBC: 8.4 10*3/uL (ref 4.0–10.5)

## 2017-03-05 LAB — BASIC METABOLIC PANEL
ANION GAP: 5 (ref 5–15)
BUN: 16 mg/dL (ref 6–20)
CALCIUM: 8.8 mg/dL — AB (ref 8.9–10.3)
CO2: 29 mmol/L (ref 22–32)
CREATININE: 0.91 mg/dL (ref 0.44–1.00)
Chloride: 104 mmol/L (ref 101–111)
GLUCOSE: 91 mg/dL (ref 65–99)
Potassium: 3.9 mmol/L (ref 3.5–5.1)
SODIUM: 138 mmol/L (ref 135–145)

## 2017-03-05 MED ORDER — KETOROLAC TROMETHAMINE 30 MG/ML IJ SOLN
30.0000 mg | Freq: Once | INTRAMUSCULAR | Status: AC
  Administered 2017-03-05: 30 mg via INTRAVENOUS
  Filled 2017-03-05: qty 1

## 2017-03-05 MED ORDER — SODIUM CHLORIDE 0.9 % IV BOLUS (SEPSIS)
1000.0000 mL | Freq: Once | INTRAVENOUS | Status: AC
  Administered 2017-03-05: 1000 mL via INTRAVENOUS

## 2017-03-05 NOTE — Discharge Instructions (Signed)
Call Dr. Lajuana Ripple office this morning for a time for recheck this afternoon. Her case was reviewed with her and she will see you for recheck this afternoon. You must go to this recheck for ongoing surveillance of your wounds and healing.

## 2017-03-05 NOTE — ED Triage Notes (Signed)
Pt reports bilateral breast reduction 1 week ago . sts pain is constant pain despite pain medication. Also reports swelling . Weakness, no fever and no chills. Bloody drainage.

## 2017-03-05 NOTE — ED Provider Notes (Signed)
[facility] DEPT Provider Note   CSN: [id number] Arrival date & time: 03/05/17  0736     History   Chief Complaint Chief Complaint  Patient presents with  . Post-op Problem    HPI [name] is a 51 y.o. female.  HPI Patient had breast reduction done 1 week ago. This was done by Dr.Contogiannis of [facility]. The patient reports that she continues to have a lot of swelling and pain she does not think is normal. She reports she has bruising and 2 drains in place that are putting out a pink tinged fluid now. She reports initiate leave the fluid was bloody but now it's pink tinged. She has been taking both tramadol or oxycodone for pain control. She reports still pain is very difficult to control. She reports some feeling of hot and cold but no documented fever. No nausea no vomiting. No shortness of breath. No lower extremity swelling or calf pain. Past Medical History:  Diagnosis Date  . Acid reflux disease    no meds-having tests currently  . Anxiety   . Chest pain    last chest pain 2009  . Headache(784.0)   . Osteoporosis   . Palpitations    history of PVC's -2 sinus nodes-last echo done-2009  . Premature ventricular contractions     Patient Active Problem List   Diagnosis Date Noted  . S/P hysterectomy - [facility] 04/09/2012  . Anemia 05/21/2011  . PREMATURE VENTRICULAR CONTRACTIONS 06/30/2010  . ACID REFLUX DISEASE 06/30/2010  . PALPITATIONS 06/30/2010  . CHEST PAIN, ATYPICAL 06/30/2010    Past Surgical History:  Procedure Laterality Date  . ABDOMINAL HYSTERECTOMY    . CHOLECYSTECTOMY  08/19/2012   Procedure: LAPAROSCOPIC CHOLECYSTECTOMY;  Surgeon: Harl Bowie, MD;  Location: [facility];  Service: General;  Laterality: Bilateral;  laparoscopic cholecystectomy  . TUBAL LIGATION      OB History    No data available       Home Medications    Prior to Admission medications   Medication Sig Start Date End Date Taking? Authorizing  Provider  acetaminophen (TYLENOL) 500 MG tablet Take 500 mg by mouth every 4 (four) hours as needed for moderate pain.   Yes [provider]  Cholecalciferol (VITAMIN D PO) Take 1 tablet by mouth daily.   Yes [provider]  FLUoxetine (PROZAC) 10 MG tablet Take 10 mg by mouth daily.   Yes [provider]  lamoTRIgine (LAMICTAL) 150 MG tablet Take 150 mg by mouth daily.   Yes [provider]  oxyCODONE-acetaminophen (PERCOCET/ROXICET) 5-325 MG tablet Take 2 tablets by mouth every 4 (four) hours as needed for severe pain.   Yes [provider]  amoxicillin-clavulanate (AUGMENTIN) 875-125 MG tablet Take 1 tablet by mouth every 12 (twelve) hours. Patient not taking: Reported on 03/05/2017 06/24/16   Fransico Meadow, PA-C  HYDROcodone-acetaminophen [facility]) 5-325 MG per tablet Take 1-2 tablets by mouth every 4 (four) hours as needed for pain. Patient not taking: Reported on 03/05/2017 08/19/12   Coralie Keens, MD  ibuprofen (ADVIL,MOTRIN) 800 MG tablet Take 1 tablet (800 mg total) by mouth 3 (three) times daily. Patient not taking: Reported on 03/05/2017 06/24/16   Sidney Ace    Family History Family History  Problem Relation Age of Onset  . Other Unknown        negative for premature CAD    Social History Social History  Substance Use Topics  . Smoking status: Never Smoker  .  Smokeless tobacco: Never Used  . Alcohol use Yes     Comment: occas     Allergies   Patient has no known allergies.   Review of Systems Review of Systems 10 Systems reviewed and are negative for acute change except as noted in the HPI.   Physical Exam Updated Vital Signs BP 122/75   Pulse 65   Temp 97.6 F (36.4 C)   Resp 16   LMP 04/08/2012   SpO2 100%   Physical Exam  Constitutional: She is oriented to person, place, and time.  Patient is alert and nontoxic. She is slightly tearful. No respiratory distress.  HENT:  Head: Normocephalic  and atraumatic.  Mouth/Throat: Oropharynx is clear and moist.  Eyes: EOM are normal.  Cardiovascular: Normal rate, regular rhythm, normal heart sounds and intact distal pulses.   See attached images for chest wall surgical site.  Pulmonary/Chest: Effort normal and breath sounds normal.  Abdominal: Soft. She exhibits no distension.  Musculoskeletal: Normal range of motion. She exhibits no edema or tenderness.  Calves are soft nontender. No tenderness in the popliteal fossa. No peripheral edema.  Neurological: She is alert and oriented to person, place, and time. No cranial nerve deficit. She exhibits normal muscle tone. Coordination normal.  Skin: Skin is warm and dry.           ED Treatments / Results  Labs (all labs ordered are listed, but only abnormal results are displayed) Labs Reviewed  CBC WITH DIFFERENTIAL/PLATELET - Abnormal; Notable for the following:       Result Value   RBC 3.85 (*)    All other components within normal limits  BASIC METABOLIC PANEL    EKG  EKG Interpretation None       Radiology No results found.  Procedures Procedures (including critical care time)  Medications Ordered in ED Medications  ketorolac (TORADOL) 30 MG/ML injection 30 mg (30 mg Intravenous Given 03/05/17 0901)  sodium chloride 0.9 % bolus 1,000 mL (1,000 mLs Intravenous New Bag/Given 03/05/17 0900)     Initial Impression / Assessment and Plan / ED Course  I have reviewed the triage vital signs and the nursing notes.  Pertinent labs & imaging results that were available during my care of the patient were reviewed by me and considered in my medical decision making (see chart for details).    Consult: (09:55) reviewed with Dr. Nathanial Rancher. She will see the patient for recheck in her office this afternoon.  Final Clinical Impressions(s) / ED Diagnoses   Final diagnoses:  Post-operative pain   Patient presents 7 days post breast reduction. General appearance as shown  in images looks appropriate for postoperative period. JP drains have serosanguineous drainage. There is no purulence. There is some inflammatory response at the wound margins but do not appear acutely infected. He has no white count or fever. Reviewed sleep case with Dr. Nathanial Rancher who reports she is available to see the patient for recheck this afternoon. She does advise she has seen the patient this week and had instructed the patient to contact your office for return visit today. Patient opted to seek care in the emergency department alternatively. I will advise the patient she must see her surgeon for recheck this afternoon for continuity of care and ongoing surveillance of her postoperative condition. New Prescriptions New Prescriptions   No medications on file     Charlesetta Shanks, MD 03/05/17 [phone]

## 2019-08-08 ENCOUNTER — Ambulatory Visit: Payer: Self-pay | Attending: Internal Medicine

## 2019-08-08 DIAGNOSIS — Z20822 Contact with and (suspected) exposure to covid-19: Secondary | ICD-10-CM | POA: Insufficient documentation

## 2019-08-10 LAB — NOVEL CORONAVIRUS, NAA: SARS-CoV-2, NAA: NOT DETECTED

## 2020-10-08 ENCOUNTER — Telehealth: Payer: Self-pay

## 2020-10-08 NOTE — Telephone Encounter (Signed)
Telephoned patient at home number. Left a voice message with BCCCP contact information. 

## 2020-10-23 ENCOUNTER — Other Ambulatory Visit: Payer: Self-pay | Admitting: Obstetrics and Gynecology

## 2020-10-23 DIAGNOSIS — Z1231 Encounter for screening mammogram for malignant neoplasm of breast: Secondary | ICD-10-CM

## 2020-11-21 ENCOUNTER — Ambulatory Visit: Payer: Self-pay

## 2020-12-12 ENCOUNTER — Other Ambulatory Visit: Payer: Self-pay

## 2020-12-12 ENCOUNTER — Ambulatory Visit: Payer: Self-pay | Admitting: *Deleted

## 2020-12-12 ENCOUNTER — Ambulatory Visit: Payer: Self-pay

## 2020-12-12 VITALS — BP 118/82 | Wt 217.5 lb

## 2020-12-12 DIAGNOSIS — R2231 Localized swelling, mass and lump, right upper limb: Secondary | ICD-10-CM

## 2020-12-12 DIAGNOSIS — Z1239 Encounter for other screening for malignant neoplasm of breast: Secondary | ICD-10-CM

## 2020-12-12 DIAGNOSIS — R2232 Localized swelling, mass and lump, left upper limb: Secondary | ICD-10-CM

## 2020-12-12 DIAGNOSIS — R2233 Localized swelling, mass and lump, upper limb, bilateral: Secondary | ICD-10-CM

## 2020-12-12 NOTE — Progress Notes (Signed)
[name] is a 55 y.o. female who presents to [facility] clinic today with complaint of right axillary lump x 4-5 months that is painful. Patient states the pain started one month ago. Patient stated the pain was intermittent at first and is now constant. Patient rates the pain at a 1 out of 10.    Pap Smear: Pap smear not completed today. Last Pap smear was 2 years at [facility] clinic and was normal per patient. Per patient has no history of an abnormal Pap smear. Patient has a history of a hysterectomy 04/08/2012 due to firbroids. Patient doesn't need any further Pap smears due to her history of a hysterectomy for benign reasons per BCCCP and ASCCP guidelines. Last Pap smear result is not available in Epic.   Physical exam: Breasts Breasts symmetrical. Scars observed bilateral lower breast due to history of breast reduction surgery. No nipple retraction bilateral breasts. No nipple discharge bilateral breasts. No lymphadenopathy. Palpated a bb sized lump within the right axilla at 10 o'clock 12 cm from the nipple. Palpated a bb sized left axillary lump at 1 o'clock 14 cm from the nipple. Complaints of tenderness when palpated bilateral axillary lumps.     Pelvic/Bimanual Pap is not indicated today per BCCCP guidelines.   Smoking History: Patient has never smoked.   Patient Navigation: Patient education provided. Access to services provided for patient through [facility] program.   Colorectal Cancer Screening: Per patient has never had colonoscopy completed. No complaints today.    Breast and Cervical Cancer Risk Assessment: Patient does not have family history of breast cancer, known genetic mutations, or radiation treatment to the chest before age 47. Patient does not have history of cervical dysplasia, immunocompromised, or DES exposure in-utero.  Risk Assessment    Risk Scores      12/12/2020   Last edited by: Royston Bake, CMA   5-year risk: 0.7 %   Lifetime risk: 5.6 %          A: BCCCP exam without pap smear Complaint of right axillary lump and pain.  P: Referred patient to the [facility] for a diagnostic mammogram. Appointment scheduled Thursday, January 02, 2021 at 1310.  Loletta Parish, RN 12/12/2020 2:51 PM

## 2020-12-12 NOTE — Patient Instructions (Signed)
Explained breast self awareness with [name]. Patient did not need a Pap smear today due to history of hysterectomy for benign reasons. Let patient know that she doesn't need any further Pap smears due to her history of a hysterectomy for benign reasons. Referred patient to the [facility] for a diagnostic mammogram. Appointment scheduled Thursday, January 02, 2021 at 1310. Patient aware of appointment and will be there. Amelianna Goforth verbalized understanding.  Monzerrath Mcburney, Arvil Chaco, RN 2:51 PM

## 2021-01-02 ENCOUNTER — Other Ambulatory Visit: Payer: No Typology Code available for payment source

## 2021-01-17 ENCOUNTER — Encounter: Payer: No Typology Code available for payment source | Admitting: Obstetrics and Gynecology

## 2021-01-23 ENCOUNTER — Other Ambulatory Visit: Payer: Self-pay

## 2021-01-23 ENCOUNTER — Ambulatory Visit
Admission: RE | Admit: 2021-01-23 | Discharge: 2021-01-23 | Disposition: A | Discharge status: Home / Self Care | Payer: No Typology Code available for payment source | Source: Ambulatory Visit | Attending: Obstetrics and Gynecology | Admitting: Obstetrics and Gynecology

## 2021-01-23 ENCOUNTER — Other Ambulatory Visit: Payer: Self-pay | Admitting: Obstetrics and Gynecology

## 2021-01-23 DIAGNOSIS — R2233 Localized swelling, mass and lump, upper limb, bilateral: Secondary | ICD-10-CM

## 2022-03-13 IMAGING — US US AXILLARY RIGHT
1 series · 2 of 2 positions shown · non-contrast
Comparison: Previous exam(s).

CLINICAL DATA: The patient presents with pain and lumpiness in both
axilla

EXAM:
DIGITAL DIAGNOSTIC BILATERAL MAMMOGRAM WITH TOMOSYNTHESIS AND CAD;
US AXILLARY RIGHT; US AXILLARY LEFT
TECHNIQUE: Bilateral digital diagnostic mammography and breast tomosynthesis
was performed. The images were evaluated with computer-aided
detection.; Targeted ultrasound examination of the right axilla was
performed.; Targeted ultrasound examination of the left axilla was
performed.

[Series 1: us axillary right · 0.09mm/px · 2 of 2 slices shown]
[im 1/2]
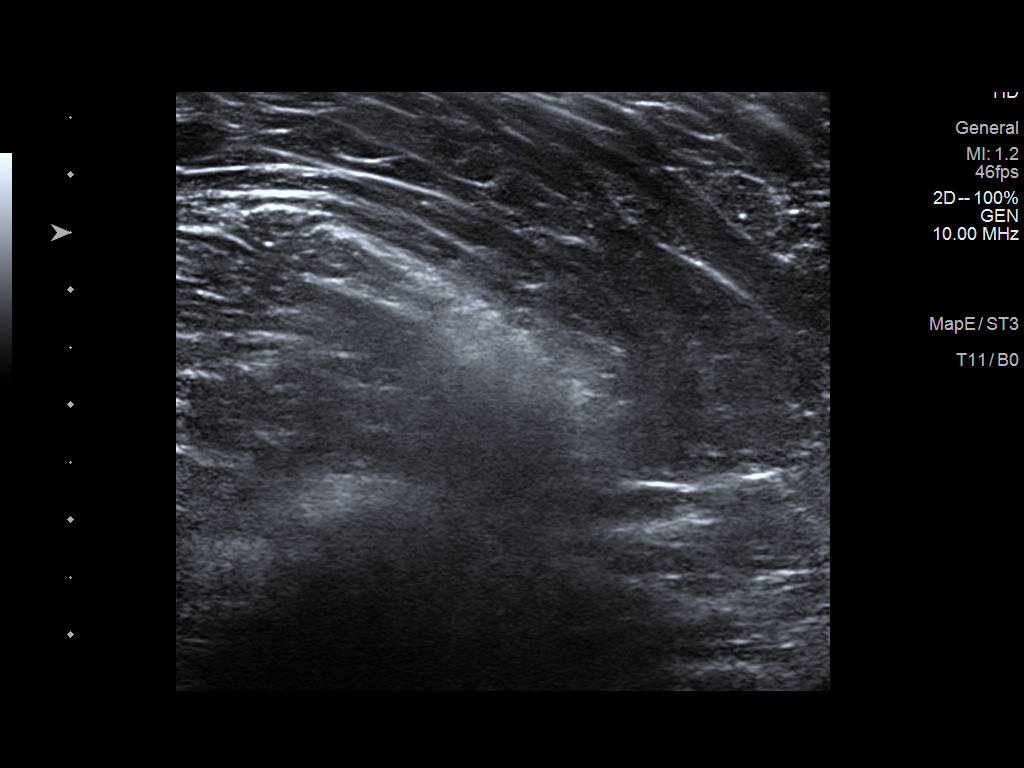
[im 2/2]
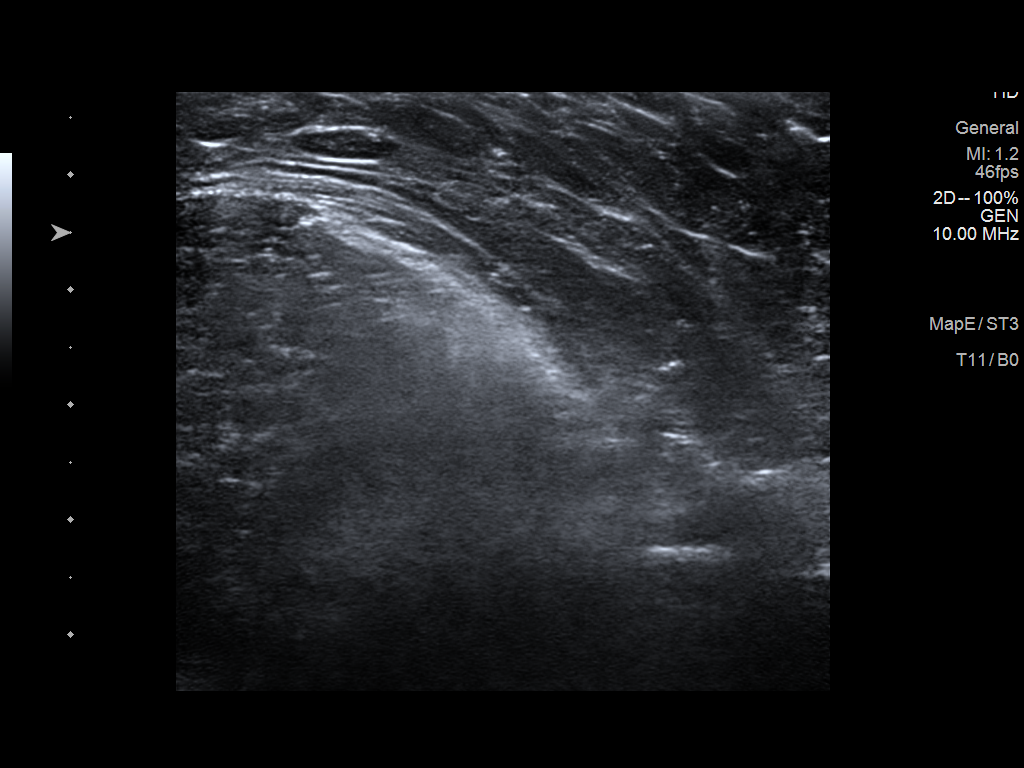

[2 of 2 positions shown; findings below may reference images not displayed]

ACR Breast Density Category b: There are scattered areas of
fibroglandular density.
FINDINGS: Post reduction changes are seen bilaterally. There are probably
benign calcifications in the lateral superior right breast which are
loosely grouped and round/punctate. No suspicious masses identified
in either breast.

On physical exam, no suspicious lumps are identified.

Targeted ultrasound is performed, showing no abnormalities in either
axilla.
IMPRESSION: Probably benign right breast calcifications as above. No cause for
the patient's axillary symptoms. No other suspicious findings.

RECOMMENDATION:
Six-month follow-up mammography of the probably benign right breast
calcifications.

I have discussed the findings and recommendations with the patient.
If applicable, a reminder letter will be sent to the patient
regarding the next appointment.

BI-RADS CATEGORY  3: Probably benign.

## 2022-03-13 IMAGING — US US AXILLARY LEFT
1 series · 4 of 4 positions shown · non-contrast
Comparison: Previous exam(s).

CLINICAL DATA: The patient presents with pain and lumpiness in both
axilla

EXAM:
DIGITAL DIAGNOSTIC BILATERAL MAMMOGRAM WITH TOMOSYNTHESIS AND CAD;
US AXILLARY RIGHT; US AXILLARY LEFT
TECHNIQUE: Bilateral digital diagnostic mammography and breast tomosynthesis
was performed. The images were evaluated with computer-aided
detection.; Targeted ultrasound examination of the right axilla was
performed.; Targeted ultrasound examination of the left axilla was
performed.

[Series 1: us axillary left · 0.08mm/px · 4 of 4 slices shown]
[im 1/4]
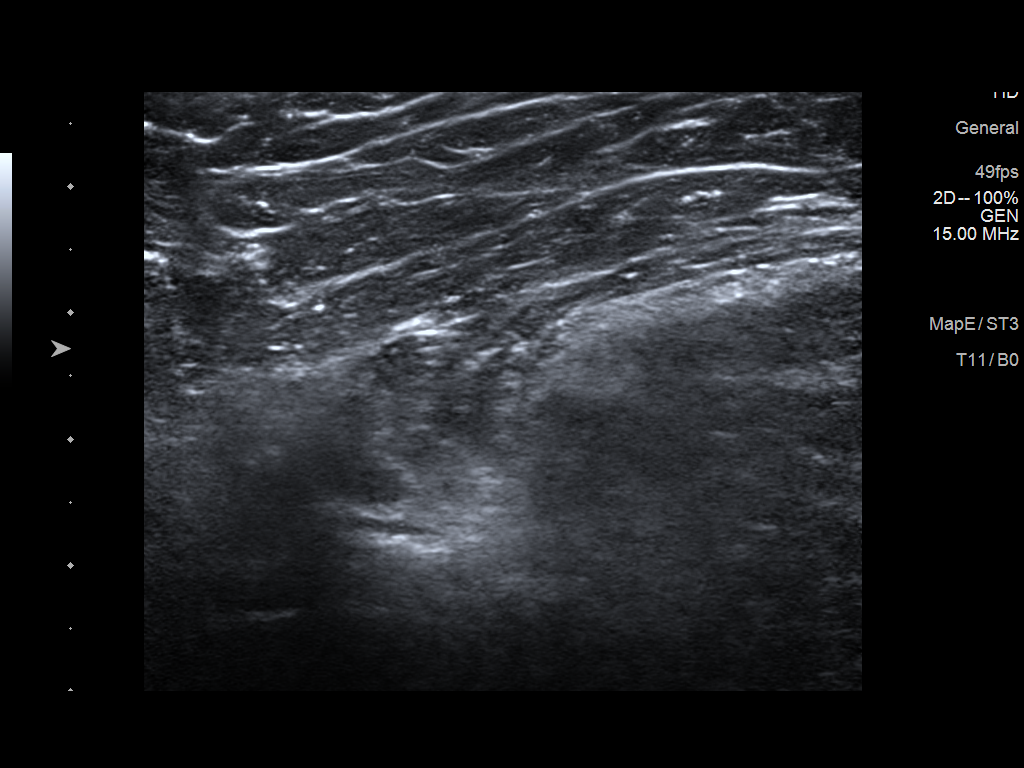
[im 2/4]
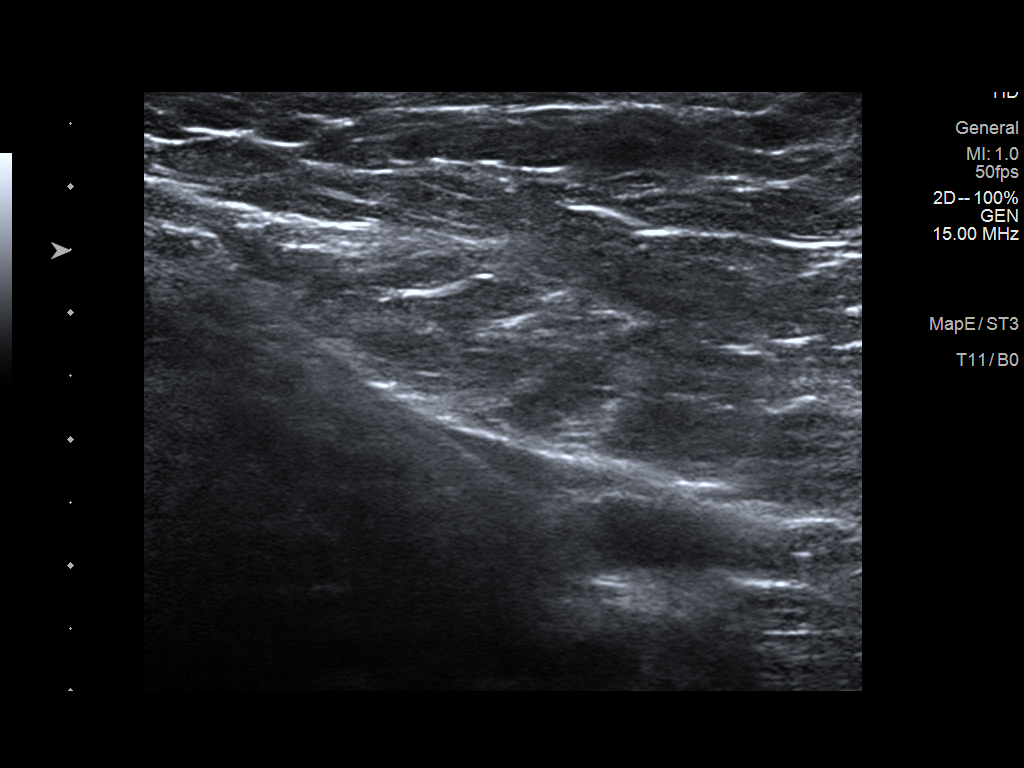
[im 3/4]
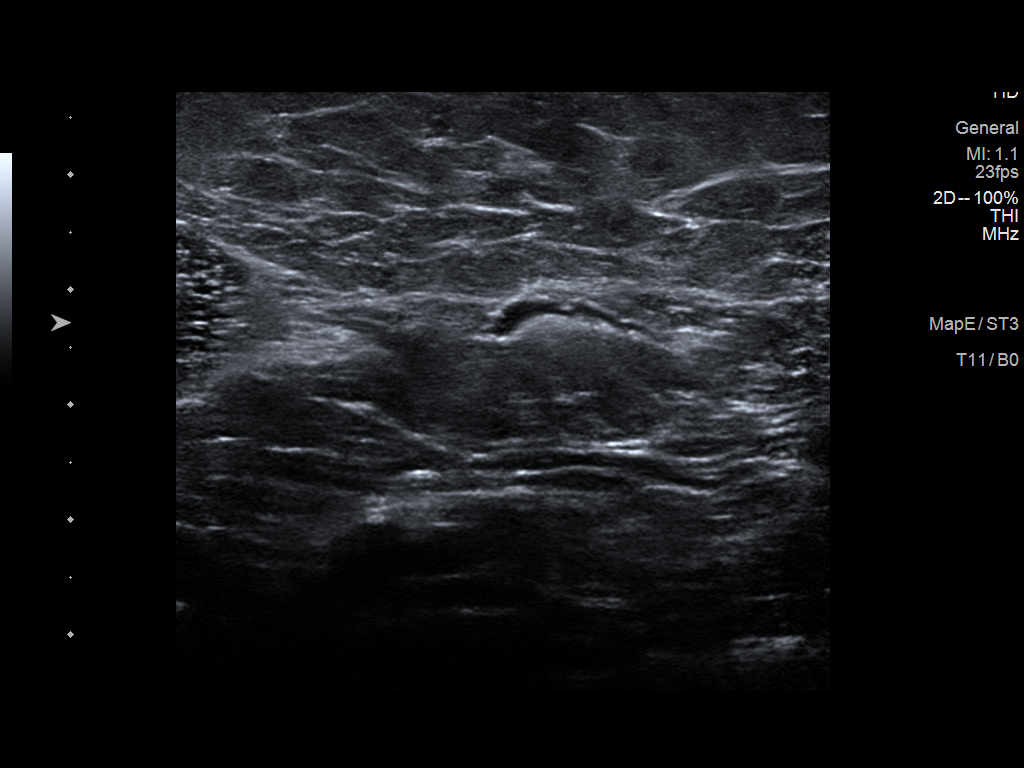
[im 4/4]
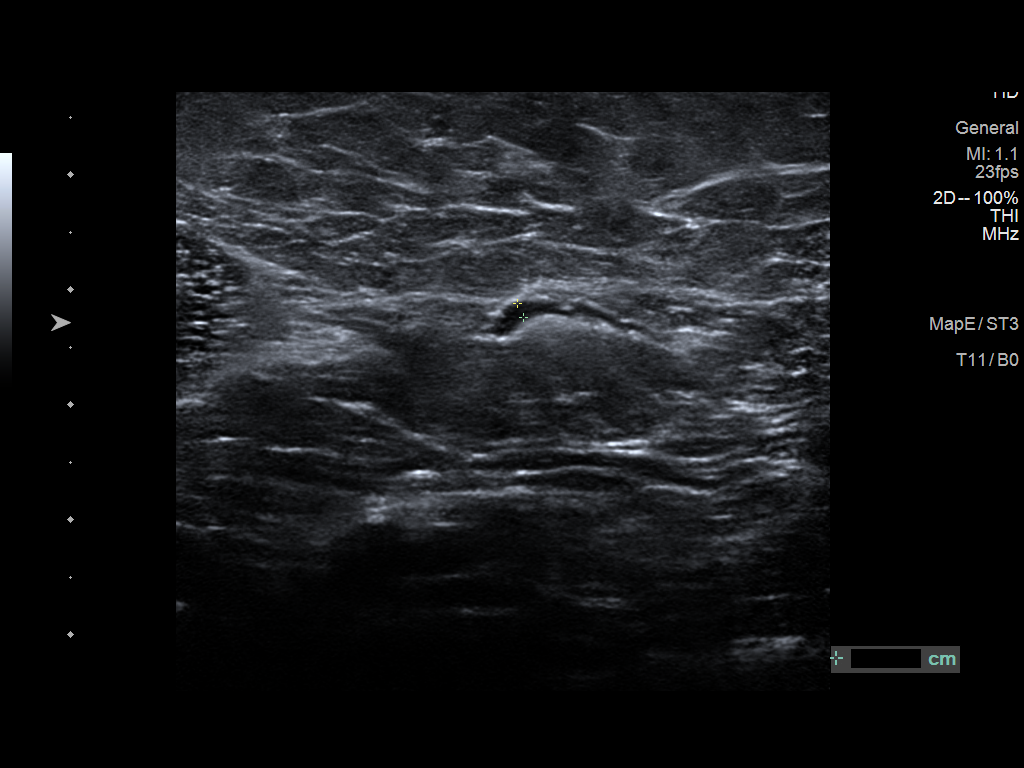

[4 of 4 positions shown; findings below may reference images not displayed]

ACR Breast Density Category b: There are scattered areas of
fibroglandular density.
FINDINGS: Post reduction changes are seen bilaterally. There are probably
benign calcifications in the lateral superior right breast which are
loosely grouped and round/punctate. No suspicious masses identified
in either breast.

On physical exam, no suspicious lumps are identified.

Targeted ultrasound is performed, showing no abnormalities in either
axilla.
IMPRESSION: Probably benign right breast calcifications as above. No cause for
the patient's axillary symptoms. No other suspicious findings.

RECOMMENDATION:
Six-month follow-up mammography of the probably benign right breast
calcifications.

I have discussed the findings and recommendations with the patient.
If applicable, a reminder letter will be sent to the patient
regarding the next appointment.

BI-RADS CATEGORY  3: Probably benign.

## 2022-03-13 IMAGING — MG DIGITAL DIAGNOSTIC BILAT W/ TOMO W/ CAD
8 of 21 series · 8 of 40 positions shown · non-contrast
Comparison: Previous exam(s).

CLINICAL DATA: The patient presents with pain and lumpiness in both
axilla

EXAM:
DIGITAL DIAGNOSTIC BILATERAL MAMMOGRAM WITH TOMOSYNTHESIS AND CAD;
US AXILLARY RIGHT; US AXILLARY LEFT
TECHNIQUE: Bilateral digital diagnostic mammography and breast tomosynthesis
was performed. The images were evaluated with computer-aided
detection.; Targeted ultrasound examination of the right axilla was
performed.; Targeted ultrasound examination of the left axilla was
performed.

[R CC (1 of 2)]
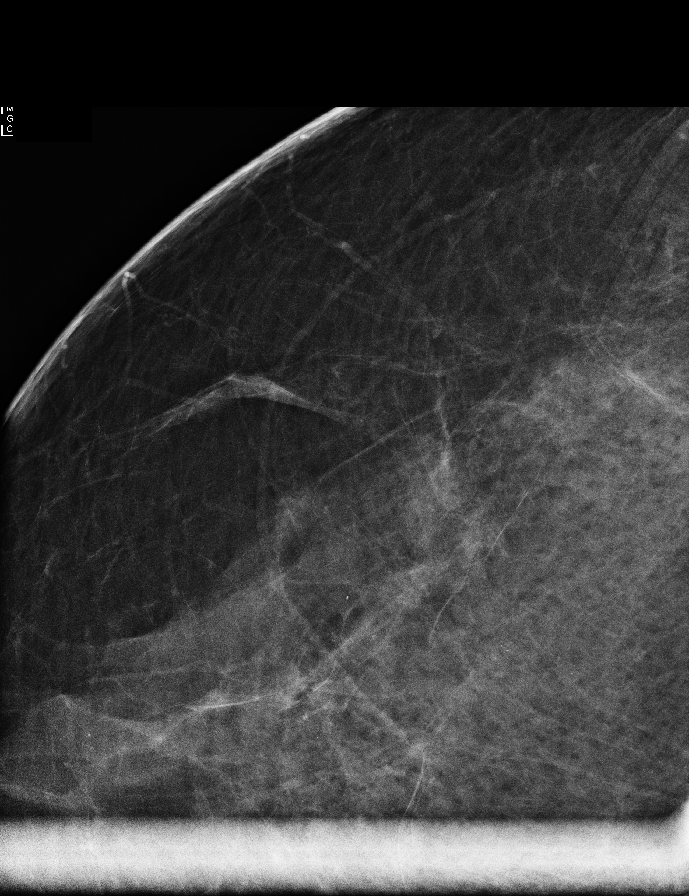

[R CC (2 of 2)]
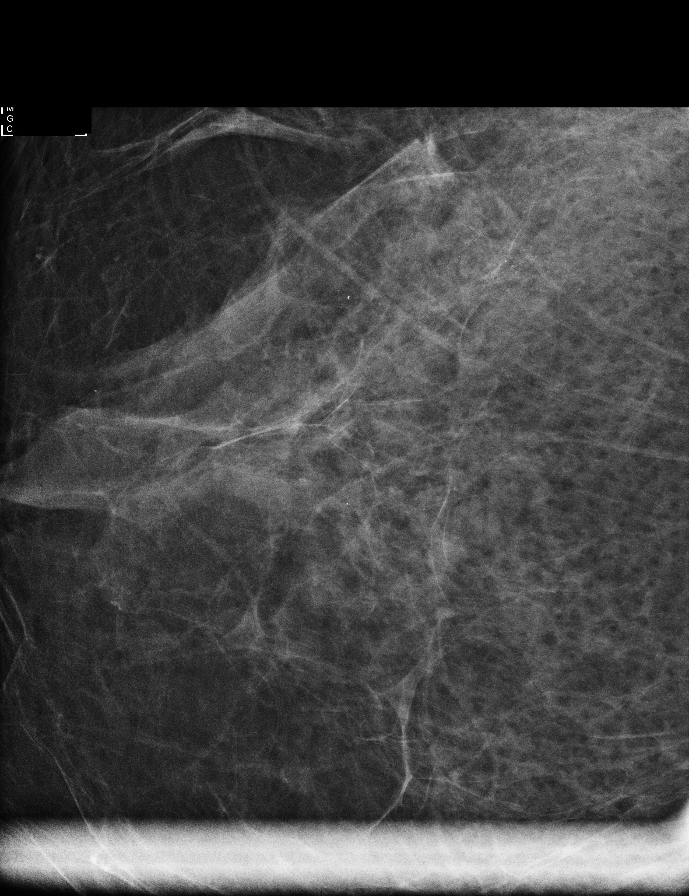

[R MLO synth-2D (1 of 3)]
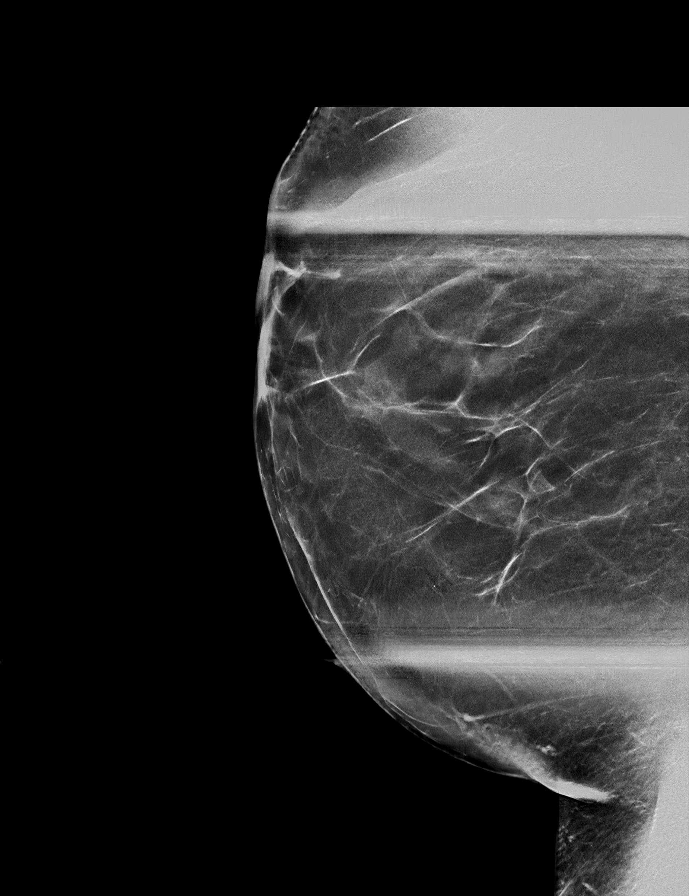

[R MLO synth-2D (2 of 3)]
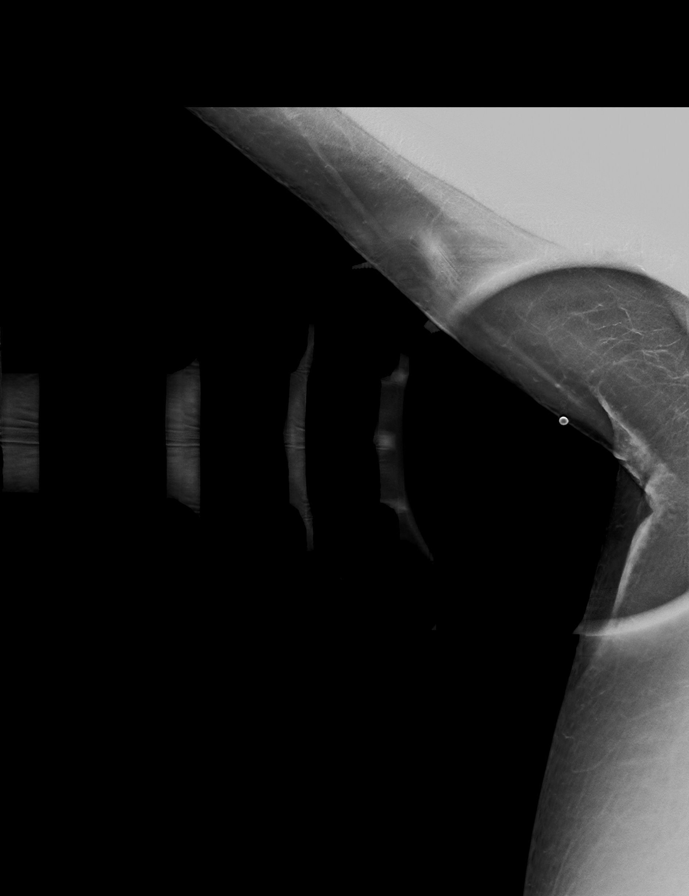

[R CC synth-2D]
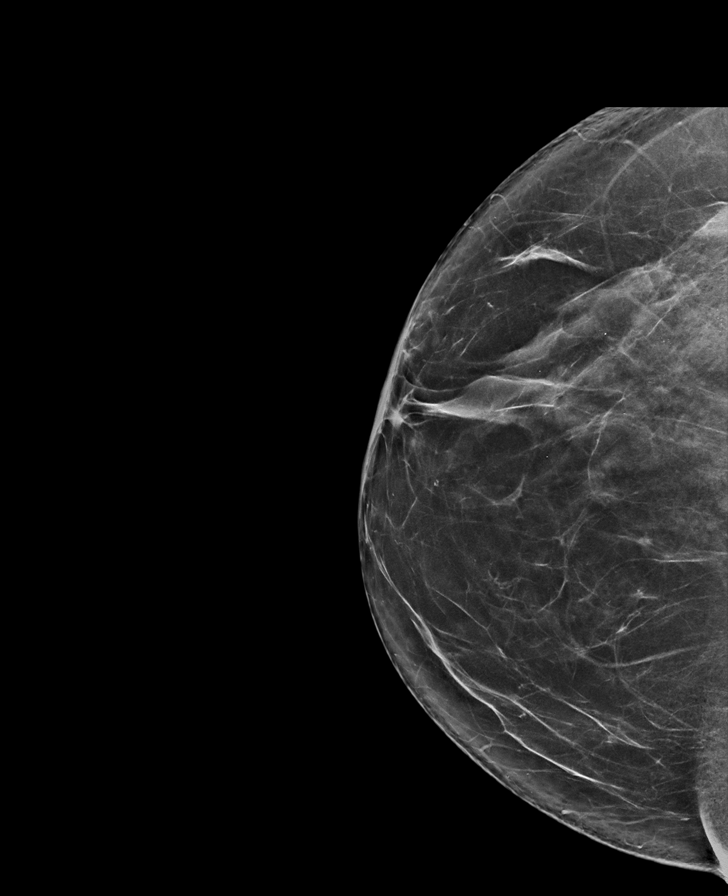

[R MLO synth-2D (3 of 3)]
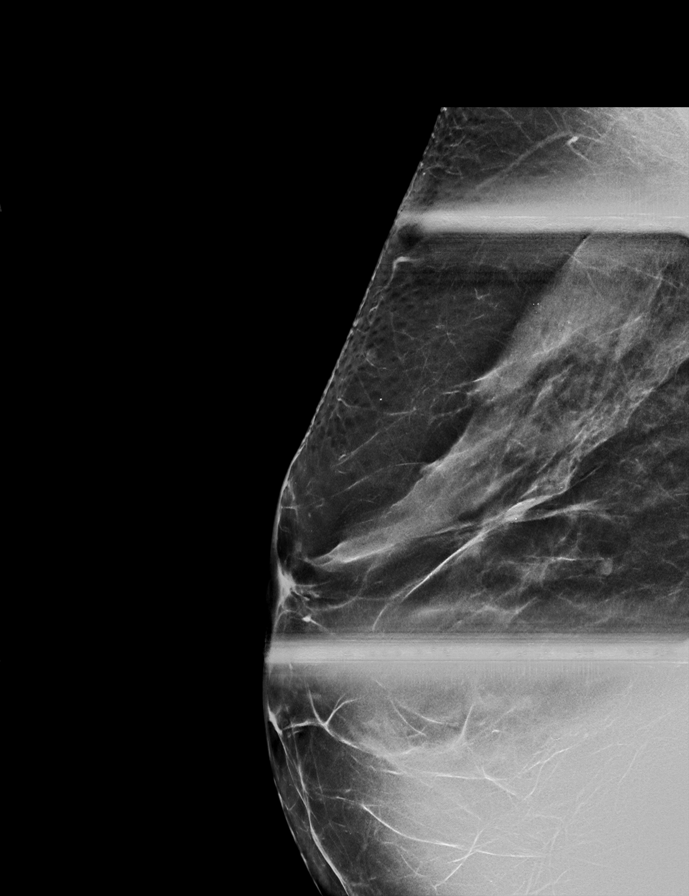

[L CC synth-2D (1 of 2)]
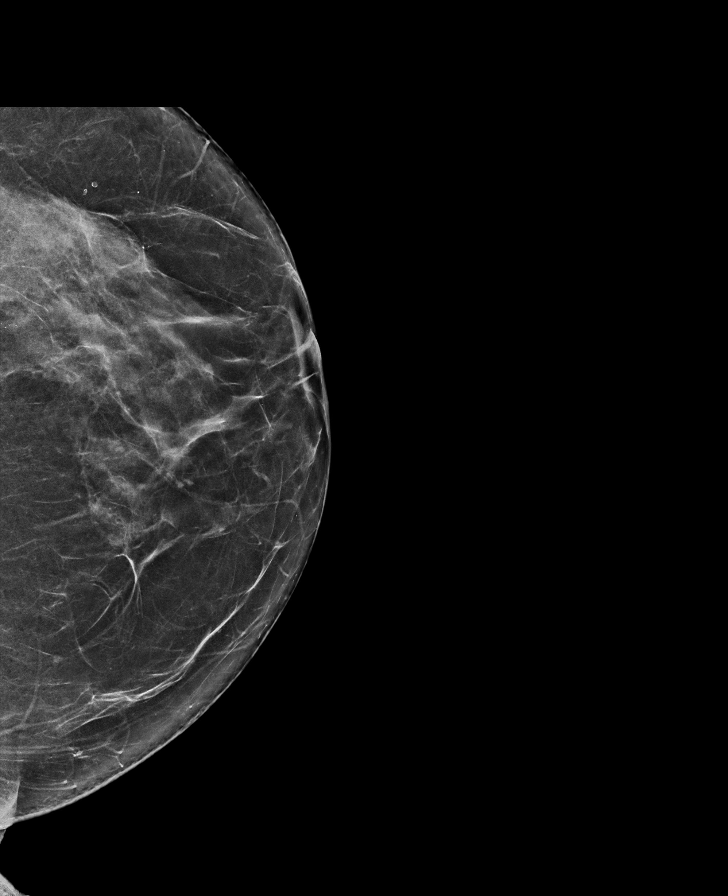

[L CC synth-2D (2 of 2)]
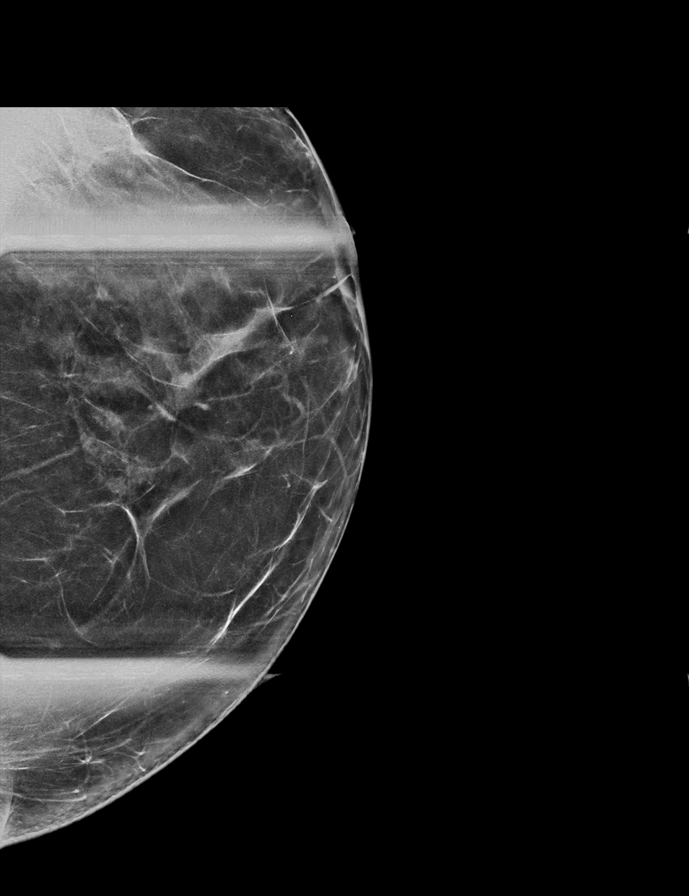

[8 of 40 positions shown; findings below may reference images not displayed]

ACR Breast Density Category b: There are scattered areas of
fibroglandular density.
FINDINGS: Post reduction changes are seen bilaterally. There are probably
benign calcifications in the lateral superior right breast which are
loosely grouped and round/punctate. No suspicious masses identified
in either breast.

On physical exam, no suspicious lumps are identified.

Targeted ultrasound is performed, showing no abnormalities in either
axilla.
IMPRESSION: Probably benign right breast calcifications as above. No cause for
the patient's axillary symptoms. No other suspicious findings.

RECOMMENDATION:
Six-month follow-up mammography of the probably benign right breast
calcifications.

I have discussed the findings and recommendations with the patient.
If applicable, a reminder letter will be sent to the patient
regarding the next appointment.

BI-RADS CATEGORY  3: Probably benign.
